# Patient Record
Sex: Female | Born: 1940
Health system: Southern US, Community
[De-identification: ages and names within clinical notes are randomized; demographics above are authoritative.]

## PROBLEM LIST (undated history)

## (undated) DIAGNOSIS — M1990 Unspecified osteoarthritis, unspecified site: Secondary | ICD-10-CM

## (undated) DIAGNOSIS — I10 Essential (primary) hypertension: Secondary | ICD-10-CM

## (undated) DIAGNOSIS — Z8619 Personal history of other infectious and parasitic diseases: Secondary | ICD-10-CM

## (undated) DIAGNOSIS — K219 Gastro-esophageal reflux disease without esophagitis: Secondary | ICD-10-CM

## (undated) DIAGNOSIS — E785 Hyperlipidemia, unspecified: Secondary | ICD-10-CM

## (undated) HISTORY — DX: Personal history of other infectious and parasitic diseases: Z86.19

## (undated) HISTORY — DX: Essential (primary) hypertension: I10

## (undated) HISTORY — DX: Hyperlipidemia, unspecified: E78.5

## (undated) HISTORY — DX: Unspecified osteoarthritis, unspecified site: M19.90

## (undated) HISTORY — PX: COLONOSCOPY: SHX174

## (undated) HISTORY — PX: DILATION AND CURETTAGE OF UTERUS: SHX78

## (undated) HISTORY — DX: Gastro-esophageal reflux disease without esophagitis: K21.9

---
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
## 1998-06-23 ENCOUNTER — Other Ambulatory Visit: Admission: RE | Admit: 1998-06-23 | Discharge: 1998-06-23 | Payer: Self-pay | Admitting: Gynecology

## 1999-11-23 ENCOUNTER — Other Ambulatory Visit: Admission: RE | Admit: 1999-11-23 | Discharge: 1999-11-23 | Payer: Self-pay | Admitting: Gynecology

## 2000-01-24 ENCOUNTER — Emergency Department (HOSPITAL_COMMUNITY): Admission: EM | Admit: 2000-01-24 | Discharge: 2000-01-24 | Payer: Self-pay | Admitting: Emergency Medicine

## 2000-12-30 ENCOUNTER — Other Ambulatory Visit: Admission: RE | Admit: 2000-12-30 | Discharge: 2000-12-30 | Payer: Self-pay | Admitting: Obstetrics and Gynecology

## 2003-01-15 ENCOUNTER — Other Ambulatory Visit: Admission: RE | Admit: 2003-01-15 | Discharge: 2003-01-15 | Payer: Self-pay | Admitting: *Deleted

## 2005-08-09 HISTORY — PX: ELBOW SURGERY: SHX618

## 2006-06-03 ENCOUNTER — Emergency Department (HOSPITAL_COMMUNITY): Admission: EM | Admit: 2006-06-03 | Discharge: 2006-06-03 | Payer: Self-pay | Admitting: Family Medicine

## 2006-06-09 ENCOUNTER — Ambulatory Visit (HOSPITAL_COMMUNITY): Admission: RE | Admit: 2006-06-09 | Discharge: 2006-06-10 | Payer: Self-pay | Admitting: Orthopedic Surgery

## 2008-04-12 ENCOUNTER — Emergency Department (HOSPITAL_COMMUNITY): Admission: EM | Admit: 2008-04-12 | Discharge: 2008-04-13 | Payer: Self-pay | Admitting: Emergency Medicine

## 2009-07-31 IMAGING — CR DG FINGER THUMB 2+V*R*
3 series · 3 of 3 positions shown · non-contrast
Comparison: None

CLINICAL DATA: Fall, laceration.

RIGHT THUMB 2+V

[x finger lateral right]
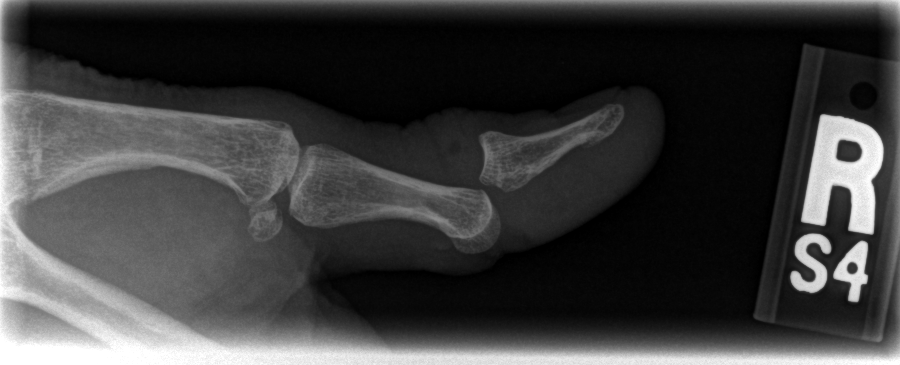

[x finger obl. right]
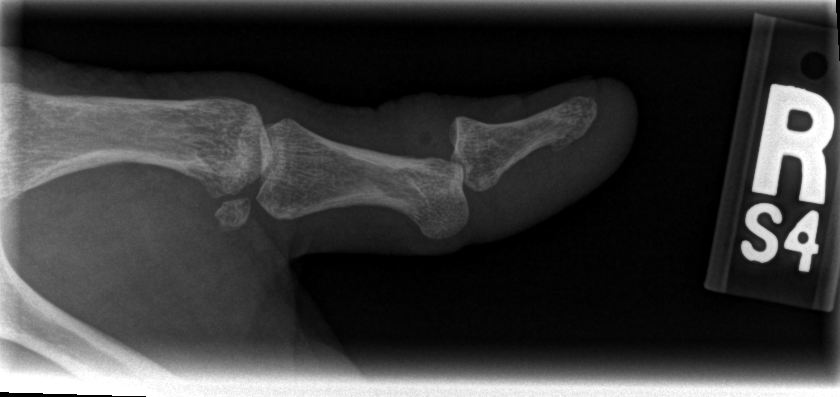

[x finger pa right]
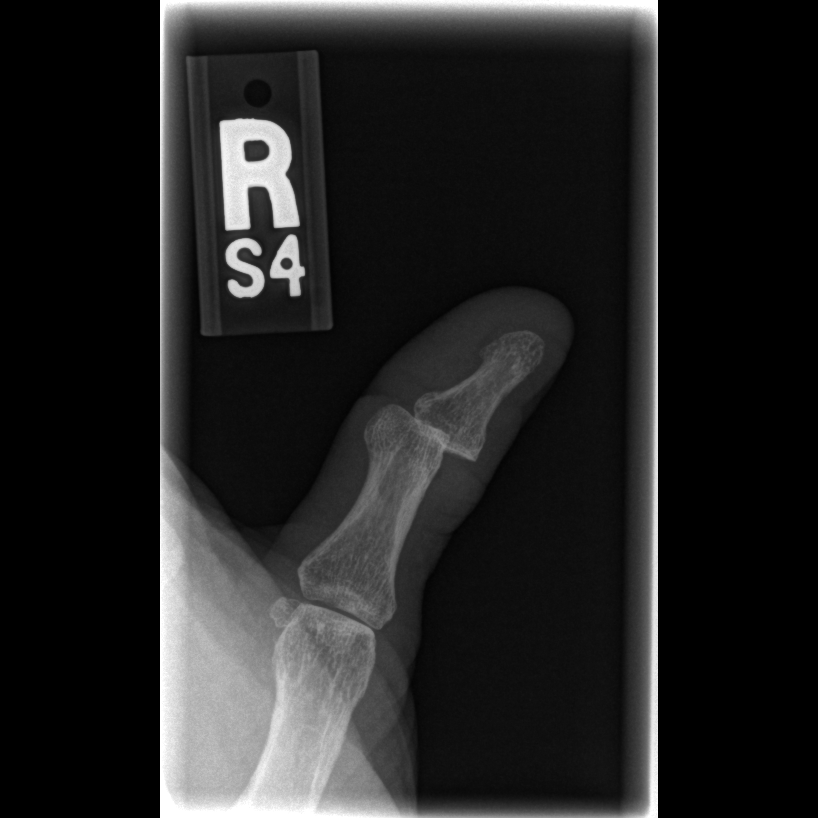

[3 of 3 positions shown; findings below may reference images not displayed]

FINDINGS: There is dislocation at the right interphalangeal joint.
Distal phalanx is dislocated posterior relative to the proximal
phalanx.  Small right gas in the soft tissues.  No fracture.
IMPRESSION: Posterior dislocation at the right thumb interphalangeal joint.

## 2009-07-31 IMAGING — CT CT HEAD W/O CM
1 series · 16 of 30 positions shown, 20 images · non-contrast
Comparison: None

CLINICAL DATA: Fall, forehead hematoma, headache.

CT HEAD WITHOUT CONTRAST
TECHNIQUE: Contiguous axial images were obtained from the base of
the skull through the vertex without contrast.

[Series 2: head_seq 4.5 h37s st · axial · 0.43mm/px · z∈[+1121,+1269]mm · 16 of 36 slices shown, 20 images]
[im 2/36  brain]
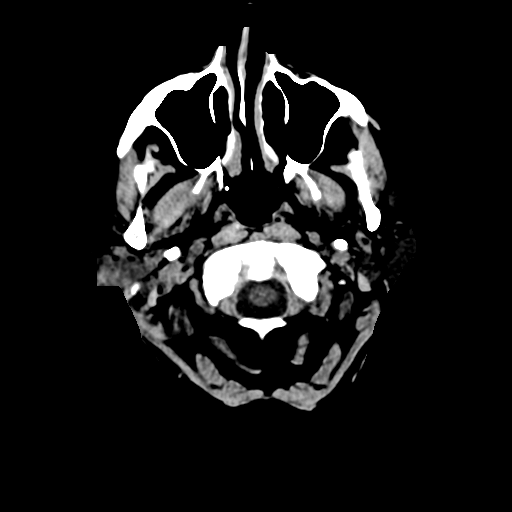
[im 2/36  bone]
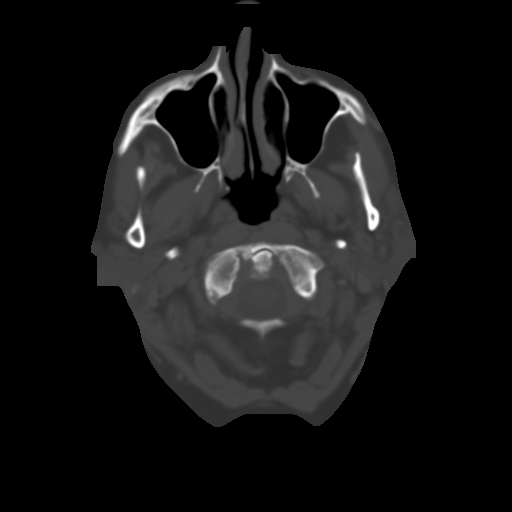
[im 4/36  brain]
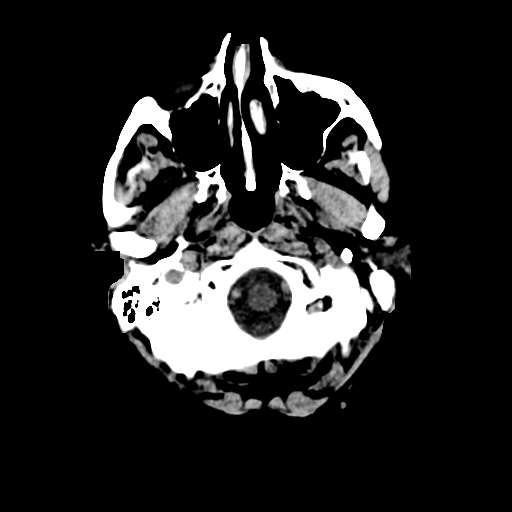
[im 7/36  brain]
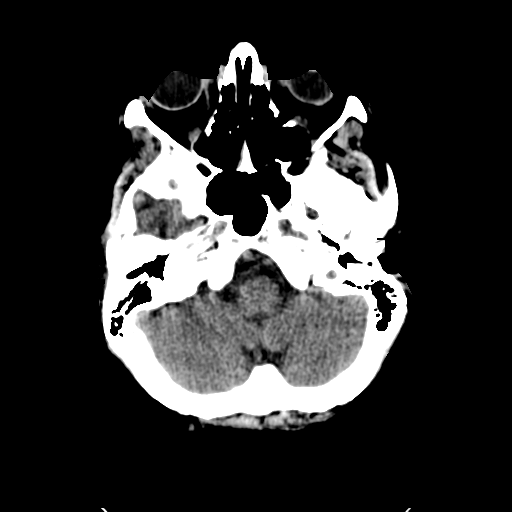
[im 9/36  brain]
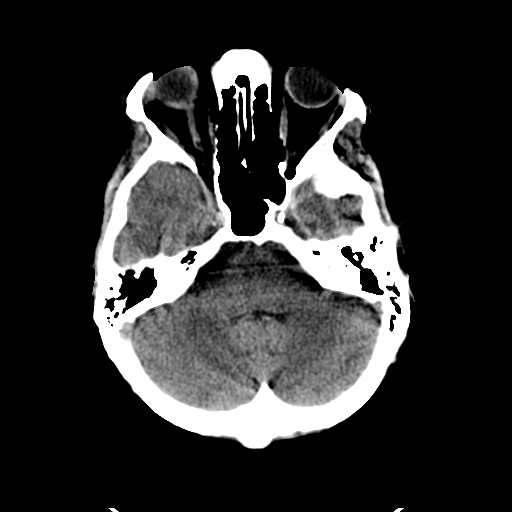
[im 10/36  brain]
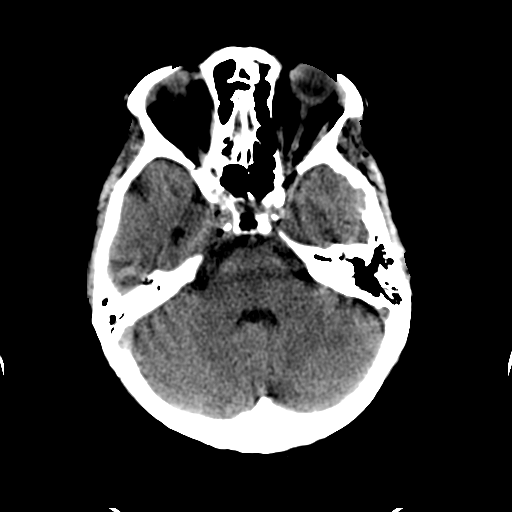
[im 10/36  bone]
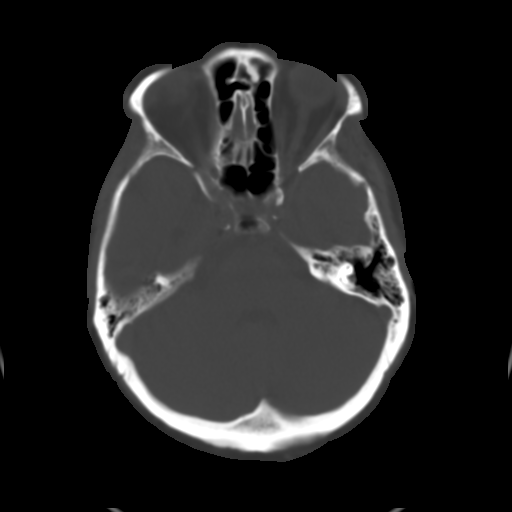
[im 13/36  brain]
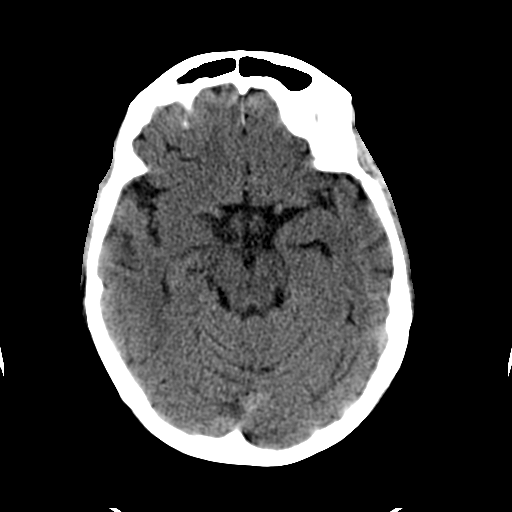
[im 15/36  brain]
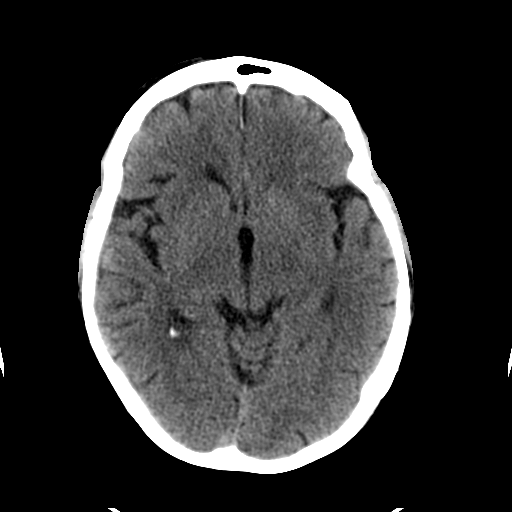
[im 17/36  brain]
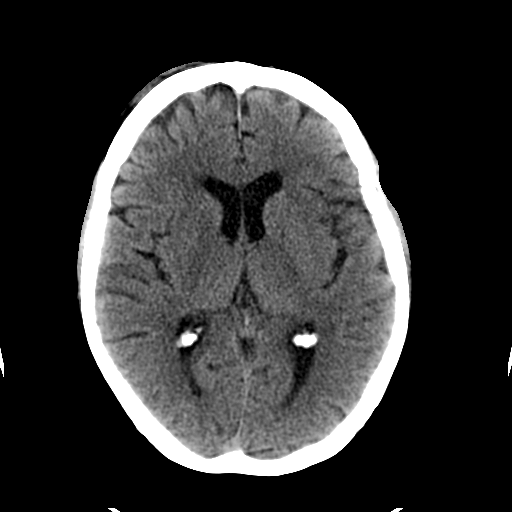
[im 19/36  brain]
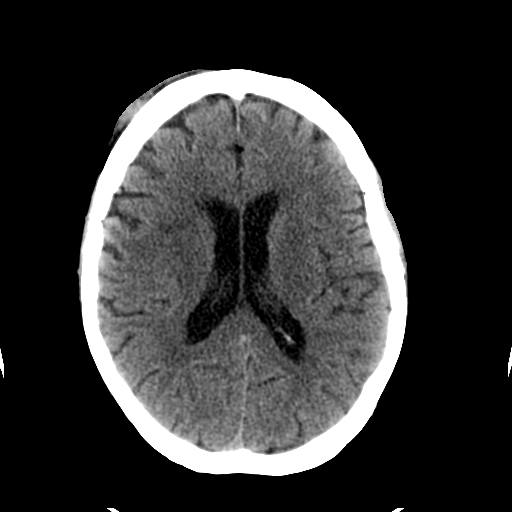
[im 19/36  bone]
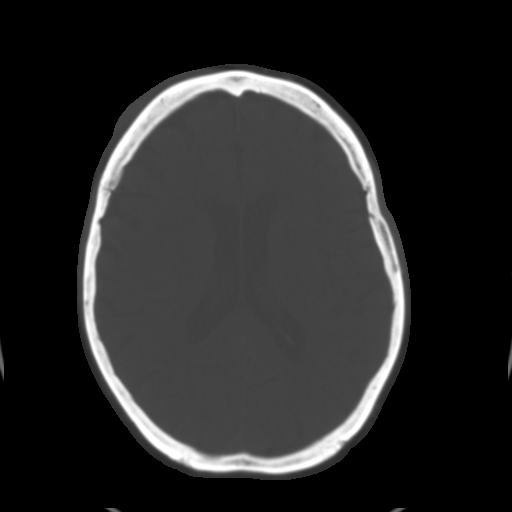
[im 21/36  brain]
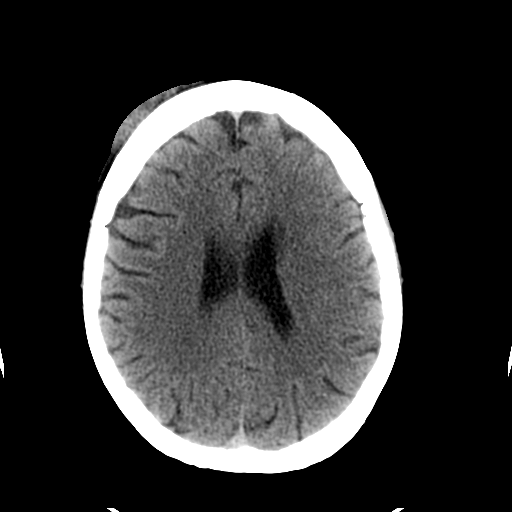
[im 23/36  brain]
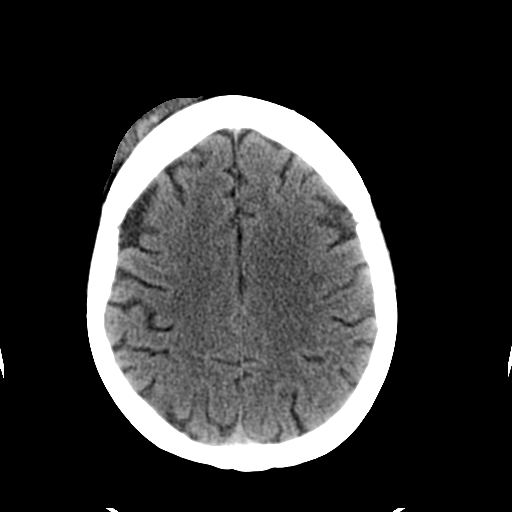
[im 26/36  brain]
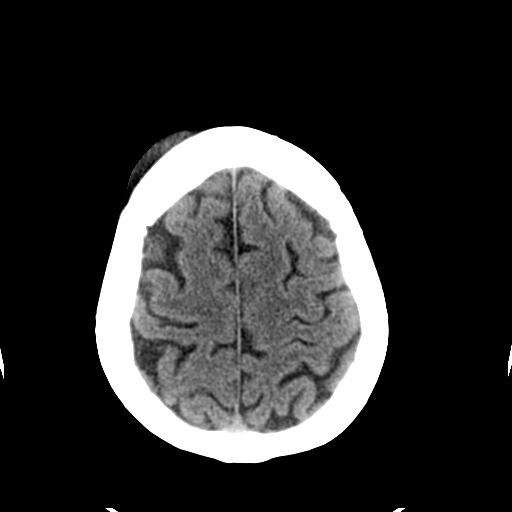
[im 27/36  brain]
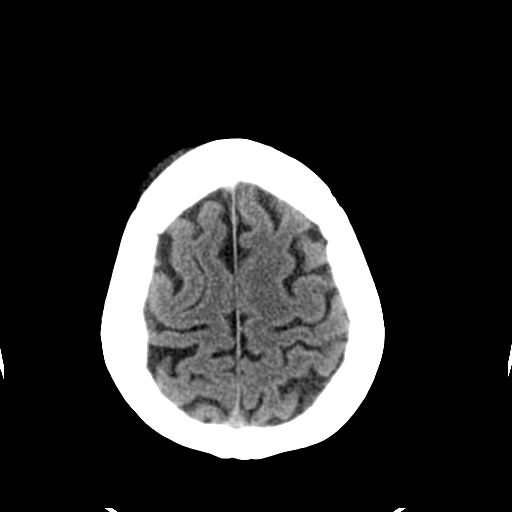
[im 27/36  bone]
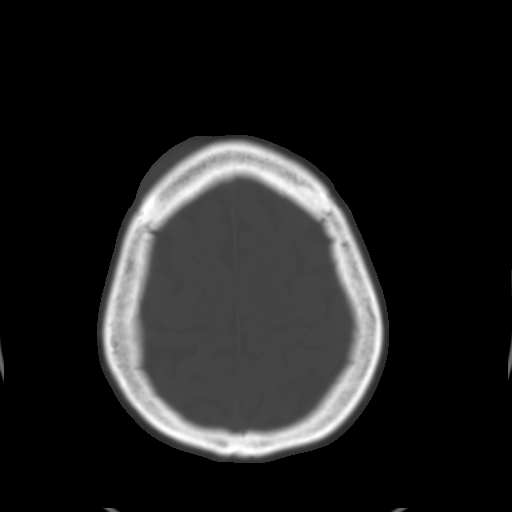
[im 29/36  brain]
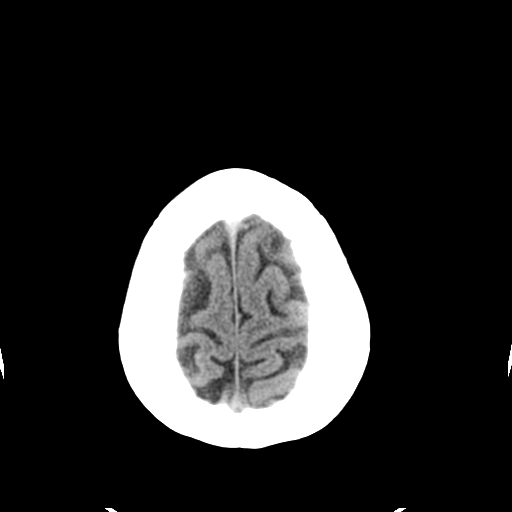
[im 32/36  brain]
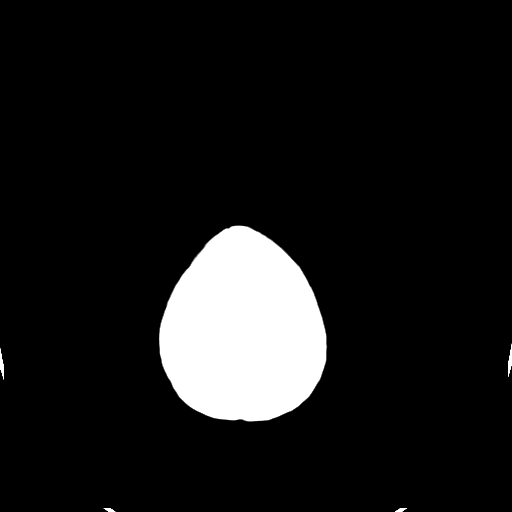
[im 34/36  brain]
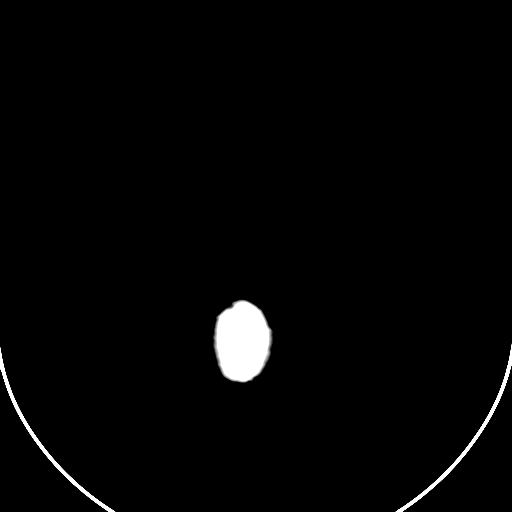

[16 of 30 positions shown; findings below may reference images not displayed]

FINDINGS: Soft tissue swelling over the right side of the forehead.
No acute intracranial abnormality.  Specifically, no hemorrhage,
hydrocephalus, mass lesion, acute infarction, or significant
intracranial injury.  No acute calvarial abnormality.
IMPRESSION: No acute intracranial abnormality.

## 2010-01-08 LAB — HM MAMMOGRAPHY: HM Mammogram: NORMAL

## 2010-11-08 LAB — HM PAP SMEAR: HM Pap smear: NORMAL

## 2010-11-08 LAB — HM DEXA SCAN: HM Dexa Scan: NORMAL

## 2010-11-09 ENCOUNTER — Ambulatory Visit (INDEPENDENT_AMBULATORY_CARE_PROVIDER_SITE_OTHER): Payer: Medicare Other | Admitting: Obstetrics and Gynecology

## 2010-11-09 ENCOUNTER — Other Ambulatory Visit (HOSPITAL_COMMUNITY)
Admission: RE | Admit: 2010-11-09 | Discharge: 2010-11-09 | Disposition: A | Discharge status: Home / Self Care | Payer: Medicare Other | Source: Ambulatory Visit | Attending: Obstetrics and Gynecology | Admitting: Obstetrics and Gynecology

## 2010-11-09 DIAGNOSIS — Z124 Encounter for screening for malignant neoplasm of cervix: Secondary | ICD-10-CM | POA: Insufficient documentation

## 2010-11-09 DIAGNOSIS — M545 Low back pain: Secondary | ICD-10-CM

## 2010-11-09 DIAGNOSIS — E7800 Pure hypercholesterolemia, unspecified: Secondary | ICD-10-CM

## 2010-11-09 DIAGNOSIS — R82998 Other abnormal findings in urine: Secondary | ICD-10-CM

## 2010-11-09 DIAGNOSIS — N952 Postmenopausal atrophic vaginitis: Secondary | ICD-10-CM

## 2010-11-09 DIAGNOSIS — N951 Menopausal and female climacteric states: Secondary | ICD-10-CM

## 2010-11-09 DIAGNOSIS — N949 Unspecified condition associated with female genital organs and menstrual cycle: Secondary | ICD-10-CM

## 2010-11-10 ENCOUNTER — Other Ambulatory Visit: Payer: Self-pay | Admitting: Obstetrics and Gynecology

## 2010-11-17 ENCOUNTER — Encounter (INDEPENDENT_AMBULATORY_CARE_PROVIDER_SITE_OTHER): Payer: Medicare Other

## 2010-11-17 DIAGNOSIS — M899 Disorder of bone, unspecified: Secondary | ICD-10-CM

## 2010-11-18 ENCOUNTER — Ambulatory Visit (INDEPENDENT_AMBULATORY_CARE_PROVIDER_SITE_OTHER): Payer: Medicare Other | Admitting: Obstetrics and Gynecology

## 2010-11-18 ENCOUNTER — Other Ambulatory Visit (INDEPENDENT_AMBULATORY_CARE_PROVIDER_SITE_OTHER): Payer: Medicare Other

## 2010-11-18 ENCOUNTER — Other Ambulatory Visit: Payer: Medicare Other

## 2010-11-18 DIAGNOSIS — N949 Unspecified condition associated with female genital organs and menstrual cycle: Secondary | ICD-10-CM

## 2010-11-18 DIAGNOSIS — N83339 Acquired atrophy of ovary and fallopian tube, unspecified side: Secondary | ICD-10-CM

## 2010-11-18 DIAGNOSIS — E7800 Pure hypercholesterolemia, unspecified: Secondary | ICD-10-CM

## 2010-11-18 DIAGNOSIS — D259 Leiomyoma of uterus, unspecified: Secondary | ICD-10-CM

## 2010-12-25 NOTE — Op Note (Signed)
Anna Hendricks, Anna Hendricks                ACCOUNT NO.:  [id number]   MEDICAL RECORD NO.:  [id number]          PATIENT TYPE:  OIB   LOCATION:  5022                         FACILITY:  [facility]   PHYSICIAN:  Dionne Ano. Gramig III, M.D.DATE OF BIRTH:  May 23, 1941   DATE OF PROCEDURE:  06/10/2006  DATE OF DISCHARGE:  06/10/2006                                 OPERATIVE REPORT   PREOPERATIVE DIAGNOSIS:  Left elbow (olecranon) displaced fracture, closed  in nature.   POSTOPERATIVE DIAGNOSIS:  Left elbow (olecranon) displaced fracture, closed  in nature.   PROCEDURE:  1. Open reduction and internal fixation, olecranon fracture, left elbow      with Christian Mate olecranon plate.  2. Stress radiography.  3. Olecranon bursa removal, left elbow.   SURGEON:  Dionne Ano. Amanda Pea, MD.   ASSISTANT:  Madelynn Done, MD.   COMPLICATIONS:  None.   ANESTHESIA:  General.   TOURNIQUET TIME:  Less than an hour.   INDICATIONS FOR THE PROCEDURE:  Ms. Anna Hendricks is a pleasant, 70 year old female  who presents with the above-mentioned diagnosis.  I have counseled her in  regards to risks and benefits of surgery including the risk of infection,  bleeding, anesthesia, damage to normal structures, and failure of the  surgery to __________  of relieving symptoms and restoring function.  With  this in mind, she desires to proceed.  All questions have been encouraged  and answered preoperatively.   OPERATIVE PROCEDURE:  The patient was seen by myself and anesthesia and  taken to the operative suite.  Once in the operative suite, preoperative  Ancef was given.  She was placed supine and appropriately padded and prepped  and draped in the usual sterile fashion with Betadine scrub and paint.   Once the sterile field was secured, the arm was elevated, the tourniquet was  insufflated to 250 mmHg and a posterior medial incision was made about the  elbow.  The patient did have a very large bursa and this bursa  was  diligently and meticulously removed so as to prevent hematoma formation and  healing problems in the future.   Following this dissection was carried down to the fracture site sharply.  The olecranon fracture was exposed.  Curettage of the bony end, irrigation  and fracture site debridement ensued.  This was tolerated well.  I then  placed provisional clamps for reduction purposes about the olecranon  achieving a nice, perfectly contoured fit of the prior fracture.  With the  patient reduced, I then applied a [employer] olecranon plate.  This was done in standard AO technique.  The patient had excellent fixation  and purchase of the plate.  Prior to final plate application, I did perform  provisional fixation with Kirchner wire as well.   Once the plate was applied nicely, including intermedullary and cortical  plate fixation, I then removed the provisional fixation and ranged the elbow  through a full range of motion realizing restoration of anatomy, full range  of motion and no complicated features.  Live fluoroscopy was brought in and  I stress tested the construct.  This was excellent.  The joint was well  reduced.  The fracture site obliterated with the fixation and anatomic  alignment noted.  I then performed closure of the ECU/FCU interval and  periosteum with Vicryl.  The patient had a slight recess in her triceps. A  full plate application and this was closed as well.   Following this, further irrigation followed by 3-0 Vicryl and staple clips  through the skin edge were applied.  The final closure was done with the  tourniquet deflated to aid in hemostasis, of course, which was secured.   Following this, a short dressing was applied and the patient had a posterior  plaster splint at 70 to 60 degrees placed.  She tolerated the procedure well  and there were no complicating features.   She was taken to the recovery room.  She will be monitored.  We will plan   for elevation, IV antibiotics and pain management according to her needs,  etc.  We will see her back in the office in 10-14 days after discharge for  suture removal, x-rays and we will begin full range of motion at that time  with therapeutic endeavors.  I have discussed with the patient all do's and  don'ts, etc., And all questions have been encouraged and answered.  It has  been a pleasure to participate in her care.  I look forward to participating  in her operative recovery.           ______________________________  Dionne Ano. Everlene Other, M.D.     Nash Mantis  D:  06/10/2006  T:  06/11/2006  Job:  [id number]

## 2011-03-02 ENCOUNTER — Ambulatory Visit (INDEPENDENT_AMBULATORY_CARE_PROVIDER_SITE_OTHER): Payer: Medicare Other | Admitting: Family Medicine

## 2011-03-02 ENCOUNTER — Encounter: Payer: Self-pay | Admitting: Family Medicine

## 2011-03-02 DIAGNOSIS — R030 Elevated blood-pressure reading, without diagnosis of hypertension: Secondary | ICD-10-CM | POA: Insufficient documentation

## 2011-03-02 DIAGNOSIS — Z1211 Encounter for screening for malignant neoplasm of colon: Secondary | ICD-10-CM | POA: Insufficient documentation

## 2011-03-02 DIAGNOSIS — Z1231 Encounter for screening mammogram for malignant neoplasm of breast: Secondary | ICD-10-CM | POA: Insufficient documentation

## 2011-03-02 DIAGNOSIS — E785 Hyperlipidemia, unspecified: Secondary | ICD-10-CM

## 2011-03-02 NOTE — Progress Notes (Signed)
Subjective:    Patient ID: Anna Hendricks, female    DOB: [birth year], 70 y.o.   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 Smoked for years  Got asthma  No copd   mammo -- last -- was in 2011 and ? Unsure what month  She likes to get them every other year - but is open to yearly - will think about it    imms Td was 2009 Pneumovax about 5 years ago at age 70  Has zostavax ? 4 years ago  Does get a flu shot every year     Colon screen - has had 2 of them One when very young (blood in stool ) -- was fine Last one was 10 years ago    Takes some vitamins   Bone density Just had one -- -- no change  Normal range she thinks - was in April   bp up today 158/90 Feels fine No cp or ha or palp or edema  Thinks this is white coat bp at home are fine   Patient Active Problem List  Diagnoses  . Hyperlipidemia  . Blood pressure elevated without history of HTN  . Special screening for malignant neoplasms, colon  . Other screening mammogram   Past Medical History  Diagnosis Date  . History of chicken pox   . Arthritis   . GERD (gastroesophageal reflux disease)   . Hyperlipidemia    Past  Surgical History  Procedure Date  . Appendectomy 1955  . Elbow surgery 2007   History  Substance Use Topics  . Smoking status: Former Smoker    Quit date: 08/10/1987  . Smokeless tobacco: Not on file  . Alcohol Use: Yes   Family History  Problem Relation Age of Onset  . Arthritis Mother   . Hyperlipidemia Mother   . Stroke Mother   . Hypertension Father   . Cancer Maternal Aunt     breast CA   No Known Allergies No current outpatient prescriptions on file prior to visit.       Review of Systems Review of Systems  Constitutional: Negative for fever, appetite change, fatigue and unexpected weight change.  Eyes: Negative for pain and visual disturbance.  Respiratory: Negative for cough and shortness of breath.   Cardiovascular: Negative for cp or sob or palpitations .   Gastrointestinal: Negative for nausea, diarrhea and constipation.  Genitourinary: Negative for urgency and frequency.  Skin: Negative for pallor. or rash  Neurological: Negative for weakness, light-headedness, numbness and headaches.  Hematological: Negative for adenopathy. Does not bruise/bleed easily.  Psychiatric/Behavioral: Negative for dysphoric mood. The patient is not nervous/anxious.          Objective:   Physical Exam  Constitutional: She appears well-developed and well-nourished. No distress.  HENT:  Head: Normocephalic and atraumatic.  Right Ear: External ear normal.  Left Ear: External ear normal.  Nose: Nose normal.  Eyes: Conjunctivae and EOM are normal. Pupils are equal, round, and reactive to light.  Neck: Normal range of motion. Neck supple. No JVD present. No thyromegaly present.  Cardiovascular: Normal rate, regular rhythm, normal heart sounds and intact distal pulses.   Pulmonary/Chest: Effort normal and breath sounds normal. No respiratory distress. She has no wheezes.  Abdominal: Soft. Bowel sounds are normal. She exhibits no distension and no mass. There is no tenderness.    Musculoskeletal: Normal range of motion. She exhibits no edema and no tenderness.  Lymphadenopathy:    She has no cervical adenopathy.  Neurological: She is alert. She has normal reflexes. No cranial nerve deficit. Coordination normal.  Skin: Skin is warm and dry. No rash noted. No erythema. No pallor.  Psychiatric: She has a normal mood and affect.          Assessment & Plan:

## 2011-03-02 NOTE — Assessment & Plan Note (Signed)
Pt thinks she has white coat syndrome Will start checking bp at home Also bring cuff to f/u in 1 mo  Will re check and make a plan Disc lifestyle - excellent exercise

## 2011-03-02 NOTE — Patient Instructions (Addendum)
Please send to [facility] for last mammogram  If you change your mind about getting mammogram yearly- please let me know  Keep zantac at home for use as needed -- and if your reflux worsens or becomes persistant let me know  Avoid red meat/ fried foods/ egg yolks/ fatty breakfast meats/ butter, cheese and high fat dairy/ and shellfish   Schedule fasting lab in 1 month and then follow up  We will refer you for colonoscopy at check out  bp is high - check some at home and bring your cuff with you to next visit

## 2011-03-02 NOTE — Assessment & Plan Note (Signed)
Pt interested in screening colonosc Will ref for that Dr Juanda Chance in the past

## 2011-03-02 NOTE — Assessment & Plan Note (Signed)
From gyn office lipid profile shows chol 317 and HDL 89 and trig 131 and LDL 202 Quite high  Thinks it has been this way for a while Good diet except egg yolks- we disc this in detail and rev low sat fat diet  Will re check in 1 mo and f/u Suspect this is hereditary and we will need to disc statin treatment

## 2011-03-11 ENCOUNTER — Encounter: Payer: Self-pay | Admitting: Family Medicine

## 2011-03-30 ENCOUNTER — Other Ambulatory Visit (INDEPENDENT_AMBULATORY_CARE_PROVIDER_SITE_OTHER): Payer: Medicare Other | Admitting: Family Medicine

## 2011-03-30 DIAGNOSIS — E785 Hyperlipidemia, unspecified: Secondary | ICD-10-CM

## 2011-03-30 DIAGNOSIS — R030 Elevated blood-pressure reading, without diagnosis of hypertension: Secondary | ICD-10-CM

## 2011-03-30 LAB — COMPREHENSIVE METABOLIC PANEL
Albumin: 4.6 g/dL (ref 3.5–5.2)
Calcium: 9.6 mg/dL (ref 8.4–10.5)
Creatinine, Ser: 0.7 mg/dL (ref 0.4–1.2)
Sodium: 142 mEq/L (ref 135–145)
Total Protein: 7.8 g/dL (ref 6.0–8.3)

## 2011-03-30 LAB — TSH: TSH: 1.84 u[IU]/mL (ref 0.35–5.50)

## 2011-03-30 LAB — LIPID PANEL: Cholesterol: 314 mg/dL — ABNORMAL HIGH (ref 0–200)

## 2011-04-01 ENCOUNTER — Encounter: Payer: Self-pay | Admitting: Internal Medicine

## 2011-04-01 ENCOUNTER — Ambulatory Visit (AMBULATORY_SURGERY_CENTER): Payer: Medicare Other | Admitting: *Deleted

## 2011-04-01 VITALS — Ht 62.0 in | Wt 135.0 lb

## 2011-04-01 DIAGNOSIS — Z1211 Encounter for screening for malignant neoplasm of colon: Secondary | ICD-10-CM

## 2011-04-01 MED ORDER — SUPREP BOWEL PREP KIT 17.5-3.13-1.6 GM/177ML PO SOLN: 1.0000 | ORAL | Status: DC | PRN

## 2011-04-02 ENCOUNTER — Encounter: Payer: Self-pay | Admitting: Family Medicine

## 2011-04-02 ENCOUNTER — Ambulatory Visit (INDEPENDENT_AMBULATORY_CARE_PROVIDER_SITE_OTHER): Payer: Medicare Other | Admitting: Family Medicine

## 2011-04-02 DIAGNOSIS — I10 Essential (primary) hypertension: Secondary | ICD-10-CM | POA: Insufficient documentation

## 2011-04-02 DIAGNOSIS — E785 Hyperlipidemia, unspecified: Secondary | ICD-10-CM

## 2011-04-02 MED ORDER — ATORVASTATIN CALCIUM 20 MG PO TABS: 20.0000 mg | ORAL_TABLET | Freq: Every day | ORAL | Status: DC

## 2011-04-02 MED ORDER — AMLODIPINE BESYLATE 5 MG PO TABS: 5.0000 mg | ORAL_TABLET | Freq: Every day | ORAL | Status: DC

## 2011-04-02 NOTE — Progress Notes (Signed)
Subjective:    Patient ID: Anna Hendricks, female    DOB: [birth year], 70 y.o.   MRN: [id number]  HPI Here for f/u of elevated bp and hyperlipidemia   Adv at last visit to check bp at home Here is 152/78 Has not been checking it at home -- none of her bp cuffs have worked  She thinks it is time to go on medication   HTN runs in the family  Does not tend to swell  No headache  Or cp or palpitations      Cholesterol - last LDL in 200s  Has been watching diet  Lab Results  Component Value Date   CHOL 314* 03/30/2011   Lab Results  Component Value Date   HDL 66.80 03/30/2011   No results found for this basename: [facility]   Lab Results  Component Value Date   TRIG 141.0 03/30/2011   Lab Results  Component Value Date   CHOLHDL 5 03/30/2011   Lab Results  Component Value Date   LDLDIRECT 223.7 03/30/2011   diet is very very good and low sat fat  Mother had very high chol as well    K is mildly high occas cantelope Not a lot of high K foods  MVI could have K in it  No cramping or other symptoms   Patient Active Problem List  Diagnoses  . Hyperlipidemia  . Special screening for malignant neoplasms, colon  . Other screening mammogram  . Hypertension   Past Medical History  Diagnosis Date  . History of chicken pox   . Arthritis   . GERD (gastroesophageal reflux disease)   . Hyperlipidemia   . Hypertension    Past Surgical History  Procedure Date  . Appendectomy 1955  . Elbow surgery 2007  . Colonoscopy   . Dilation and curettage of uterus    History  Substance Use Topics  . Smoking status: Former Smoker    Quit date: 08/10/1987  . Smokeless tobacco: Never Used  . Alcohol Use: 2.4 oz/week    4 Glasses of wine per week   Family History  Problem Relation Age of Onset  . Arthritis Mother   . Hyperlipidemia Mother   . Stroke Mother   . Hypertension Father   . Cancer Maternal Aunt     breast CA   No Known Allergies Current Outpatient Prescriptions  on File Prior to Visit  Medication Sig Dispense Refill  . CALCIUM-VITAMIN D PO Take by mouth as directed.        . Cholecalciferol (VITAMIN D) 2000 UNITS tablet Take 2,000 Units by mouth daily.        . fish oil-omega-3 fatty acids 1000 MG capsule Take 600 mg by mouth daily.        . Multiple Vitamin (MULTIVITAMIN) tablet Take 1 tablet by mouth daily.        . vitamin C (ASCORBIC ACID) 500 MG tablet Take 500 mg by mouth daily.        . vitamin E 400 UNIT capsule Take 400 Units by mouth daily.        Rolene Arbour BOWEL PREP SOLN Take 1 kit by mouth as needed.  354 mL  0       Review of Systems Review of Systems  Constitutional: Negative for fever, appetite change, fatigue and unexpected weight change.  Eyes: Negative for pain and visual disturbance.  Respiratory: Negative for cough and shortness of breath.   Cardiovascular: Negative.  for cp or  palpitations Gastrointestinal: Negative for nausea, diarrhea and constipation.  Genitourinary: Negative for urgency and frequency.  Skin: Negative for pallor. or rash  Neurological: Negative for weakness, light-headedness, numbness and headaches.  Hematological: Negative for adenopathy. Does not bruise/bleed easily.  Psychiatric/Behavioral: Negative for dysphoric mood. The patient is not nervous/anxious.          Objective:   Physical Exam  Constitutional: She appears well-developed and well-nourished. No distress.  HENT:  Head: Normocephalic and atraumatic.  Mouth/Throat: Oropharynx is clear and moist.  Eyes: Conjunctivae and EOM are normal. Pupils are equal, round, and reactive to light.  Neck: Normal range of motion. Neck supple. No JVD present. Carotid bruit is not present. No thyromegaly present.  Cardiovascular: Normal rate, regular rhythm, normal heart sounds and intact distal pulses.   Pulmonary/Chest: Effort normal and breath sounds normal. No respiratory distress. She has no wheezes.  Abdominal: Soft. Bowel sounds are normal. She  exhibits no distension, no abdominal bruit and no mass. There is no tenderness.  Musculoskeletal: Normal range of motion. She exhibits no edema and no tenderness.  Lymphadenopathy:    She has no cervical adenopathy.  Neurological: She is alert. She has normal reflexes. No cranial nerve deficit. Coordination normal.  Skin: Skin is warm and dry. No rash noted. No erythema. No pallor.  Psychiatric: She has a normal mood and affect.          Assessment & Plan:

## 2011-04-02 NOTE — Assessment & Plan Note (Signed)
Very high again despite near perfect diet  Start lipitor 20 mg - rev poss side eff Lab 1 mo  F/u 6 wk  Rev low sat fat diet again Rev lab in detail with pt

## 2011-04-02 NOTE — Assessment & Plan Note (Signed)
New with high systolic No symptoms  Start amlodipine 5 mg daily Update if side eff Disc low sodium diet and exercise

## 2011-04-02 NOTE — Patient Instructions (Addendum)
Start norvasc 5mg  daily for blood pressure Start lipitor 20 mg daily in evening for cholesterol  If any side effects - like muscle aches and pains- let me know Schedule fasting labs in 1 month  Follow up with me in 6 weeks  Blood pressure cuff- OMRON for arm - regular size  Eat healthy and exercise  Stop your multivitamin

## 2011-04-16 ENCOUNTER — Other Ambulatory Visit: Payer: Medicare Other | Admitting: Internal Medicine

## 2011-04-16 ENCOUNTER — Encounter: Payer: Self-pay | Admitting: Internal Medicine

## 2011-04-16 ENCOUNTER — Ambulatory Visit (AMBULATORY_SURGERY_CENTER): Payer: Medicare Other | Admitting: Internal Medicine

## 2011-04-16 VITALS — BP 148/87 | HR 63 | Temp 98.0°F | Resp 16 | Ht 62.0 in | Wt 135.0 lb

## 2011-04-16 DIAGNOSIS — D126 Benign neoplasm of colon, unspecified: Secondary | ICD-10-CM

## 2011-04-16 DIAGNOSIS — Z1211 Encounter for screening for malignant neoplasm of colon: Secondary | ICD-10-CM

## 2011-04-16 MED ORDER — SODIUM CHLORIDE 0.9 % IV SOLN: 500.0000 mL | INTRAVENOUS | Status: DC

## 2011-04-16 NOTE — Patient Instructions (Addendum)
RESUME ALL MEDICATIONS. INFORMATION GIVEN ON POLYPS, DIVERTICULOSIS, HIGH FIBER DIET.PHYSICIAN RECOMMEND METAMUCIL 1 TSP DAILY. D/C INSTRUCTIONS COMPLETED.

## 2011-04-19 ENCOUNTER — Telehealth: Payer: Self-pay | Admitting: *Deleted

## 2011-04-19 NOTE — Telephone Encounter (Signed)

## 2011-04-21 ENCOUNTER — Encounter: Payer: Self-pay | Admitting: Internal Medicine

## 2011-05-03 ENCOUNTER — Other Ambulatory Visit (INDEPENDENT_AMBULATORY_CARE_PROVIDER_SITE_OTHER): Payer: Medicare Other

## 2011-05-03 DIAGNOSIS — E785 Hyperlipidemia, unspecified: Secondary | ICD-10-CM

## 2011-05-03 DIAGNOSIS — I10 Essential (primary) hypertension: Secondary | ICD-10-CM

## 2011-05-03 LAB — ALT: ALT: 22 U/L (ref 0–35)

## 2011-05-03 LAB — POTASSIUM: Potassium: 4.3 mEq/L (ref 3.5–5.1)

## 2011-05-03 LAB — LIPID PANEL
Cholesterol: 214 mg/dL — ABNORMAL HIGH (ref 0–200)
HDL: 95.8 mg/dL (ref >39.00)
VLDL: 12 mg/dL (ref 0.0–40.0)

## 2011-05-03 NOTE — Progress Notes (Signed)
Addended by: Baldomero Lamy on: 05/03/2011 10:54 AM   Modules accepted: Orders

## 2011-05-14 ENCOUNTER — Encounter: Payer: Self-pay | Admitting: Family Medicine

## 2011-05-14 ENCOUNTER — Ambulatory Visit (INDEPENDENT_AMBULATORY_CARE_PROVIDER_SITE_OTHER): Payer: Medicare Other | Admitting: Family Medicine

## 2011-05-14 VITALS — BP 148/72 | HR 68 | Temp 98.1°F | Ht 62.0 in | Wt 134.8 lb

## 2011-05-14 DIAGNOSIS — Z23 Encounter for immunization: Secondary | ICD-10-CM

## 2011-05-14 DIAGNOSIS — I10 Essential (primary) hypertension: Secondary | ICD-10-CM

## 2011-05-14 DIAGNOSIS — E785 Hyperlipidemia, unspecified: Secondary | ICD-10-CM

## 2011-05-14 MED ORDER — LISINOPRIL 10 MG PO TABS: 10.0000 mg | ORAL_TABLET | Freq: Every day | ORAL | Status: DC

## 2011-05-14 NOTE — Progress Notes (Signed)
Subjective:    Patient ID: Anna Hendricks, female    DOB: [birth year], 70 y.o.   MRN: [id number]  HPI Here for f/u of HTN and also hyperlipidemia   Last visit started norvasc 5 mg  No cp or ha or edema No problems   Lipids are much imp on lipitor LDL down much Lab Results  Component Value Date   CHOL 214* 05/03/2011   HDL 95.80 05/03/2011   LDLDIRECT 110.7 05/03/2011   TRIG 60.0 05/03/2011   CHOLHDL 2 05/03/2011   last LDL was over 200   Diet has been very good - stays away from sat fats  Has tried egg beaters   Tolerates lipitor well - at first had some muscle aches and now it is better (? If was in her mind)    Wt stable - eats a healthy diet   Patient Active Problem List  Diagnoses  . Hyperlipidemia  . Special screening for malignant neoplasms, colon  . Other screening mammogram  . Hypertension   Past Medical History  Diagnosis Date  . History of chicken pox   . Arthritis   . GERD (gastroesophageal reflux disease)   . Hyperlipidemia   . Hypertension    Past Surgical History  Procedure Date  . Appendectomy 1955  . Elbow surgery 2007  . Colonoscopy   . Dilation and curettage of uterus    History  Substance Use Topics  . Smoking status: Former Smoker    Quit date: 08/10/1987  . Smokeless tobacco: Never Used  . Alcohol Use: 2.4 oz/week    4 Glasses of wine per week   Family History  Problem Relation Age of Onset  . Arthritis Mother   . Hyperlipidemia Mother   . Stroke Mother   . Hypertension Father   . Cancer Maternal Aunt     breast CA   No Known Allergies Current Outpatient Prescriptions on File Prior to Visit  Medication Sig Dispense Refill  . atorvastatin (LIPITOR) 20 MG tablet Take 1 tablet (20 mg total) by mouth daily.  30 tablet  11  . CALCIUM-VITAMIN D PO Take by mouth as directed.        . Cholecalciferol (VITAMIN D) 2000 UNITS tablet Take 2,000 Units by mouth daily.        . fish oil-omega-3 fatty acids 1000 MG capsule Take 600 mg by  mouth daily.        . Multiple Vitamin (MULTIVITAMIN) tablet Take 1 tablet by mouth daily.        . vitamin C (ASCORBIC ACID) 500 MG tablet Take 500 mg by mouth daily.        . vitamin E 400 UNIT capsule Take 400 Units by mouth daily.            Review of Systems Review of Systems  Constitutional: Negative for fever, appetite change, fatigue and unexpected weight change.  Eyes: Negative for pain and visual disturbance.  Respiratory: Negative for cough and shortness of breath.   Cardiovascular: Negative for cp or palpitations    Gastrointestinal: Negative for nausea, diarrhea and constipation.  Genitourinary: Negative for urgency and frequency.  Skin: Negative for pallor or rash   Neurological: Negative for weakness, light-headedness, numbness and headaches.  Hematological: Negative for adenopathy. Does not bruise/bleed easily.  Psychiatric/Behavioral: Negative for dysphoric mood. The patient is not nervous/anxious.          Objective:   Physical Exam  Constitutional: She appears well-developed and well-nourished.  HENT:  Head: Normocephalic and atraumatic.  Mouth/Throat: Oropharynx is clear and moist.  Eyes: Conjunctivae and EOM are normal. Pupils are equal, round, and reactive to light.  Neck: Normal range of motion. Neck supple. No JVD present. Carotid bruit is not present. No thyromegaly present.  Cardiovascular: Normal rate, regular rhythm, normal heart sounds and intact distal pulses.  Exam reveals no gallop.   Pulmonary/Chest: Effort normal and breath sounds normal. No respiratory distress. She has no wheezes.  Abdominal: Soft. Bowel sounds are normal. She exhibits no abdominal bruit.  Musculoskeletal: She exhibits no edema.  Lymphadenopathy:    She has no cervical adenopathy.  Neurological: She is alert. She has normal reflexes.  Skin: Skin is warm and dry. No rash noted. No erythema. No pallor.  Psychiatric: She has a normal mood and affect.          Assessment &  Plan:

## 2011-05-14 NOTE — Assessment & Plan Note (Signed)
No improvement on amlodipine 5 Will change to lisinopril 10 mg daily Rev poss side eff incl cough -will update F/u 4-6 wk

## 2011-05-14 NOTE — Assessment & Plan Note (Signed)
Much improved on lipitor 20 and pt is tolerating it well  Rev low sat fat diet -doing great

## 2011-05-14 NOTE — Patient Instructions (Addendum)
Stop the amlodipine (norvasc) because it is not doing the job Don't throw it away  Start lisinopril 10 mg daily  Follow up in 4-6 week Potassium is better Cholesterol is better Keep up the good habits  Flu shot today

## 2011-06-25 ENCOUNTER — Ambulatory Visit: Payer: Medicare Other | Admitting: Family Medicine

## 2011-07-06 ENCOUNTER — Ambulatory Visit: Payer: Medicare Other | Admitting: Family Medicine

## 2012-05-03 ENCOUNTER — Other Ambulatory Visit: Payer: Self-pay

## 2012-05-03 MED ORDER — LISINOPRIL 10 MG PO TABS: 10.0000 mg | ORAL_TABLET | Freq: Every day | ORAL | Status: DC

## 2012-05-03 NOTE — Telephone Encounter (Signed)
Pt left v/m requesting refill lisiniopril to [facility] [address]. Pt last seen 05/14/11.pt scheduled CPX 09/24/12.Please advise.

## 2012-05-03 NOTE — Telephone Encounter (Signed)
Go ahead and refil until her physical- thanks

## 2012-05-17 ENCOUNTER — Other Ambulatory Visit: Payer: Self-pay

## 2012-05-17 MED ORDER — ATORVASTATIN CALCIUM 20 MG PO TABS: 20.0000 mg | ORAL_TABLET | Freq: Every day | ORAL | Status: DC

## 2012-05-17 NOTE — Telephone Encounter (Signed)
Pt request refill atorvastatin # 30 x 6. Pt has CPX scheduled 10/05/11. Pt notified done.

## 2012-06-01 ENCOUNTER — Other Ambulatory Visit: Payer: Self-pay | Admitting: Dermatology

## 2012-09-02 ENCOUNTER — Other Ambulatory Visit: Payer: Self-pay | Admitting: Family Medicine

## 2012-09-04 ENCOUNTER — Other Ambulatory Visit: Payer: Self-pay

## 2012-09-04 MED ORDER — LISINOPRIL 10 MG PO TABS: 10.0000 mg | ORAL_TABLET | Freq: Every day | ORAL | Status: DC

## 2012-09-04 NOTE — Telephone Encounter (Signed)
Pt left v/m requesting refill lisinopril [facility] on [address]. Pt already scheduled CPX 10/04/12. Pt notified refill done.

## 2012-09-25 ENCOUNTER — Telehealth: Payer: Self-pay | Admitting: Family Medicine

## 2012-09-25 DIAGNOSIS — I10 Essential (primary) hypertension: Secondary | ICD-10-CM

## 2012-09-25 DIAGNOSIS — E785 Hyperlipidemia, unspecified: Secondary | ICD-10-CM

## 2012-09-25 NOTE — Telephone Encounter (Signed)
Message copied by Judy Pimple on Mon Sep 25, 2012  9:20 PM ------      Message from: Alvina Chou      Created: Tue Sep 19, 2012  3:33 PM      Regarding: Lab orders for Tuesday, 2.18.14       Patient is scheduled for CPX labs, please order future labs, Thanks , Terri       ------

## 2012-09-26 ENCOUNTER — Other Ambulatory Visit (INDEPENDENT_AMBULATORY_CARE_PROVIDER_SITE_OTHER): Payer: Medicare Other

## 2012-09-26 DIAGNOSIS — E785 Hyperlipidemia, unspecified: Secondary | ICD-10-CM

## 2012-09-26 DIAGNOSIS — I10 Essential (primary) hypertension: Secondary | ICD-10-CM

## 2012-09-26 LAB — COMPREHENSIVE METABOLIC PANEL
Albumin: 4.6 g/dL (ref 3.5–5.2)
BUN: 20 mg/dL (ref 6–23)
Calcium: 9.9 mg/dL (ref 8.4–10.5)
Chloride: 104 mEq/L (ref 96–112)
Glucose, Bld: 98 mg/dL (ref 70–99)
Potassium: 4.8 mEq/L (ref 3.5–5.1)

## 2012-09-26 LAB — LIPID PANEL
Cholesterol: 251 mg/dL — ABNORMAL HIGH (ref 0–200)
Triglycerides: 85 mg/dL (ref 0.0–149.0)

## 2012-09-26 LAB — CBC WITH DIFFERENTIAL/PLATELET
Basophils Relative: 0.5 % (ref 0.0–3.0)
Eosinophils Relative: 3.9 % (ref 0.0–5.0)
Lymphocytes Relative: 27.3 % (ref 12.0–46.0)
Neutrophils Relative %: 60.6 % (ref 43.0–77.0)
RBC: 4.13 Mil/uL (ref 3.87–5.11)
WBC: 6.6 10*3/uL (ref 4.5–10.5)

## 2012-09-26 LAB — TSH: TSH: 1.85 u[IU]/mL (ref 0.35–5.50)

## 2012-10-04 ENCOUNTER — Ambulatory Visit (INDEPENDENT_AMBULATORY_CARE_PROVIDER_SITE_OTHER): Payer: Medicare Other | Admitting: Family Medicine

## 2012-10-04 ENCOUNTER — Encounter: Payer: Self-pay | Admitting: Family Medicine

## 2012-10-04 VITALS — BP 142/70 | HR 64 | Temp 98.7°F | Ht 61.5 in | Wt 143.0 lb

## 2012-10-04 DIAGNOSIS — Z1231 Encounter for screening mammogram for malignant neoplasm of breast: Secondary | ICD-10-CM

## 2012-10-04 DIAGNOSIS — E785 Hyperlipidemia, unspecified: Secondary | ICD-10-CM

## 2012-10-04 DIAGNOSIS — I10 Essential (primary) hypertension: Secondary | ICD-10-CM

## 2012-10-04 MED ORDER — ATORVASTATIN CALCIUM 20 MG PO TABS: 20.0000 mg | ORAL_TABLET | Freq: Every day | ORAL | Status: DC

## 2012-10-04 MED ORDER — LISINOPRIL 10 MG PO TABS: 10.0000 mg | ORAL_TABLET | Freq: Every day | ORAL | Status: DC

## 2012-10-04 NOTE — Assessment & Plan Note (Signed)
Better on 2nd check bp in fair control at this time  No changes needed  Disc lifstyle change with low sodium diet and exercise  Labs rev  Med renewed

## 2012-10-04 NOTE — Progress Notes (Signed)
Subjective:    Patient ID: Anna Hendricks, female    DOB: [birth year], 72 y.o.   MRN: [id number]  HPI Here for health maintenance exam and to review chronic medical problems    Feels great - doing well   Flu vaccine - got that in sept   colonosc 9/12  mammo 6/11- forgets to get  Self exam-no lumps or changes   No gyn problems or issues - no hx of abn paps Monogamous   Zoster status- had that vaccine  Pneumovax- had last one after the age of 50- 5 y ago Wt is up 9 lb with bmi of 26  bp is stable today  No cp or palpitations or headaches or edema  No side effects to medicines  Is not checking outside the office - taking it makes her nervous  BP Readings from Last 3 Encounters:  10/04/12 148/90  05/14/11 148/72  04/16/11 148/87     She takes water exercise and also walks Eats a healthy diet for the most part   Falls-none at all, no fractures   Mood- overall very good - very chipper   Hyperlipidemia- lipitor Lab Results  Component Value Date   CHOL 251* 09/26/2012   CHOL 214* 05/03/2011   CHOL 314* 03/30/2011   Lab Results  Component Value Date   HDL 77.60 09/26/2012   HDL 30.86 05/03/2011   HDL 57.84 03/30/2011   No results found for this basename: [facility]   Lab Results  Component Value Date   TRIG 85.0 09/26/2012   TRIG 60.0 05/03/2011   TRIG 141.0 03/30/2011   Lab Results  Component Value Date   CHOLHDL 3 09/26/2012   CHOLHDL 2 05/03/2011   CHOLHDL 5 03/30/2011   Lab Results  Component Value Date   LDLDIRECT 151.7 09/26/2012   LDLDIRECT 110.7 05/03/2011   LDLDIRECT 223.7 03/30/2011   she occasionally forgets med  Does not eat sat fats as a rule , but does like eggs   Patient Active Problem List  Diagnosis  . Hyperlipidemia  . Special screening for malignant neoplasms, colon  . Other screening mammogram  . Hypertension   Past Medical History  Diagnosis Date  . History of chicken pox   . Arthritis   . GERD (gastroesophageal reflux disease)   .  Hyperlipidemia   . Hypertension    Past Surgical History  Procedure Laterality Date  . Appendectomy  1955  . Elbow surgery  2007  . Colonoscopy    . Dilation and curettage of uterus     History  Substance Use Topics  . Smoking status: Former Smoker    Quit date: 08/10/1987  . Smokeless tobacco: Never Used  . Alcohol Use: 2.4 oz/week    4 Glasses of wine per week     Comment: occ   Family History  Problem Relation Age of Onset  . Arthritis Mother   . Hyperlipidemia Mother   . Stroke Mother   . Hypertension Father   . Cancer Maternal Aunt     breast CA   No Known Allergies Current Outpatient Prescriptions on File Prior to Visit  Medication Sig Dispense Refill  . atorvastatin (LIPITOR) 20 MG tablet Take 1 tablet (20 mg total) by mouth daily.  30 tablet  6  . CALCIUM-VITAMIN D PO Take by mouth as directed.        . fish oil-omega-3 fatty acids 1000 MG capsule Take 600 mg by mouth daily.        Marland Kitchen  lisinopril (ZESTRIL) 10 MG tablet Take 1 tablet (10 mg total) by mouth daily.  30 tablet  0  . Multiple Vitamin (MULTIVITAMIN) tablet Take 1 tablet by mouth daily.        . vitamin C (ASCORBIC ACID) 500 MG tablet Take 500 mg by mouth daily.        . vitamin E 400 UNIT capsule Take 400 Units by mouth daily.         No current facility-administered medications on file prior to visit.      Review of Systems Review of Systems  Constitutional: Negative for fever, appetite change, fatigue and unexpected weight change.  Eyes: Negative for pain and visual disturbance.  Respiratory: Negative for cough and shortness of breath.   Cardiovascular: Negative for cp or palpitations    Gastrointestinal: Negative for nausea, diarrhea and constipation.  Genitourinary: Negative for urgency and frequency.  Skin: Negative for pallor or rash   Neurological: Negative for weakness, light-headedness, numbness and headaches.  Hematological: Negative for adenopathy. Does not bruise/bleed easily.   Psychiatric/Behavioral: Negative for dysphoric mood. The patient is not nervous/anxious.         Objective:   Physical Exam  Constitutional: She appears well-developed and well-nourished. No distress.  HENT:  Head: Normocephalic and atraumatic.  Right Ear: External ear normal.  Left Ear: External ear normal.  Nose: Nose normal.  Mouth/Throat: Oropharynx is clear and moist. No oropharyngeal exudate.  Eyes: Conjunctivae and EOM are normal. Pupils are equal, round, and reactive to light. Right eye exhibits no discharge. Left eye exhibits no discharge. No scleral icterus.  Neck: Normal range of motion. Neck supple. No JVD present. Carotid bruit is not present. No thyromegaly present.  Cardiovascular: Normal rate, regular rhythm, normal heart sounds and intact distal pulses.  Exam reveals no gallop.   Pulmonary/Chest: Effort normal and breath sounds normal. No respiratory distress. She has no wheezes. She exhibits no tenderness.  Abdominal: Soft. Bowel sounds are normal. She exhibits no distension, no abdominal bruit and no mass. There is no tenderness.  Genitourinary: No breast swelling, tenderness, discharge or bleeding.  Breast exam: No mass, nodules, thickening, tenderness, bulging, retraction, inflamation, nipple discharge or skin changes noted.  No axillary or clavicular LA.  Chaperoned exam.    Musculoskeletal: She exhibits no edema and no tenderness.  Lymphadenopathy:    She has no cervical adenopathy.  Neurological: She is alert. She has normal reflexes. No cranial nerve deficit. She exhibits normal muscle tone. Coordination normal.  Skin: Skin is warm and dry. No rash noted. No erythema. No pallor.  Psychiatric: She has a normal mood and affect.          Assessment & Plan:

## 2012-10-04 NOTE — Assessment & Plan Note (Signed)
Pt will schedule her own mam at [facility] Nl exam Enc monthly self exams

## 2012-10-04 NOTE — Assessment & Plan Note (Signed)
Cholesterol is up - poss due to missed doses of atorvastatin  Re check 3 mo Disc goals for lipids and reasons to control them Rev labs with pt Rev low sat fat diet in detail Med renewed

## 2012-10-04 NOTE — Patient Instructions (Addendum)
Do not forget to schedule your mammogram at [facility]-here is the info Change lipitor dosing to morning  Schedule fasting labs 3 months for cholesterol Avoid red meat/ fried foods/ egg yolks/ fatty breakfast meats/ butter, cheese and high fat dairy/ and shellfish

## 2012-10-24 ENCOUNTER — Other Ambulatory Visit: Payer: Medicare Other

## 2013-01-04 ENCOUNTER — Other Ambulatory Visit (INDEPENDENT_AMBULATORY_CARE_PROVIDER_SITE_OTHER): Payer: Medicare Other

## 2013-01-04 DIAGNOSIS — E785 Hyperlipidemia, unspecified: Secondary | ICD-10-CM

## 2013-01-04 LAB — LDL CHOLESTEROL, DIRECT: Direct LDL: 148.7 mg/dL

## 2013-01-04 LAB — LIPID PANEL
HDL: 84.8 mg/dL (ref >39.00)
Triglycerides: 78 mg/dL (ref 0.0–149.0)

## 2013-01-05 ENCOUNTER — Encounter: Payer: Self-pay | Admitting: *Deleted

## 2013-06-24 ENCOUNTER — Emergency Department (HOSPITAL_COMMUNITY)
Admission: EM | Admit: 2013-06-24 | Discharge: 2013-06-24 | Disposition: A | Discharge status: Home / Self Care | Payer: Medicare Other | Source: Home / Self Care

## 2013-06-24 ENCOUNTER — Encounter (HOSPITAL_COMMUNITY): Payer: Self-pay | Admitting: Emergency Medicine

## 2013-06-24 DIAGNOSIS — M109 Gout, unspecified: Secondary | ICD-10-CM

## 2013-06-24 DIAGNOSIS — M79609 Pain in unspecified limb: Secondary | ICD-10-CM

## 2013-06-24 DIAGNOSIS — M79675 Pain in left toe(s): Secondary | ICD-10-CM

## 2013-06-24 MED ORDER — TRIAMCINOLONE ACETONIDE 40 MG/ML IJ SUSP
INTRAMUSCULAR | Status: AC
  Filled 2013-06-24: qty 1

## 2013-06-24 MED ORDER — TRIAMCINOLONE ACETONIDE 40 MG/ML IJ SUSP
20.0000 mg | Freq: Once | INTRAMUSCULAR | Status: AC
  Administered 2013-06-24: 20 mg via INTRA_ARTICULAR

## 2013-06-24 MED ORDER — PREDNISONE (PAK) 10 MG PO TABS: ORAL_TABLET | ORAL | Status: DC

## 2013-06-24 NOTE — ED Notes (Signed)
Left great toe pain, onset sudden, woke patient from nap Saturday afternoon.  No known injury

## 2013-06-24 NOTE — ED Provider Notes (Signed)
CSN: [id number]     Arrival date & time 06/24/13  0906 History   None    Chief Complaint  Patient presents with  . Foot Pain   (Consider location/radiation/quality/duration/timing/severity/associated sxs/prior Treatment) HPI Comments: Patient presents today with acute onset of left great toe pain, swelling, redness and warmth. Onset 24 hours. Very painful, cannot "stand to be touched". No injury. No fever or chills are noted. No previous incidence.    Patient is a 72 y.o. female presenting with lower extremity pain.  Foot Pain    Past Medical History  Diagnosis Date  . History of chicken pox   . Arthritis   . GERD (gastroesophageal reflux disease)   . Hyperlipidemia   . Hypertension    Past Surgical History  Procedure Laterality Date  . Appendectomy  1955  . Elbow surgery  2007  . Colonoscopy    . Dilation and curettage of uterus     Family History  Problem Relation Age of Onset  . Arthritis Mother   . Hyperlipidemia Mother   . Stroke Mother   . Hypertension Father   . Cancer Maternal Aunt     breast CA   History  Substance Use Topics  . Smoking status: Former Smoker    Quit date: 08/10/1987  . Smokeless tobacco: Never Used  . Alcohol Use: 2.4 oz/week    4 Glasses of wine per week     Comment: occ   OB History   Grav Para Term Preterm Abortions TAB SAB Ect Mult Living                 Review of Systems  All other systems reviewed and are negative.    Allergies  Review of patient's allergies indicates no known allergies.  Home Medications   Current Outpatient Rx  Name  Route  Sig  Dispense  Refill  . aspirin 81 MG tablet   Oral   Take 81 mg by mouth daily.         Marland Kitchen atorvastatin (LIPITOR) 20 MG tablet   Oral   Take 1 tablet (20 mg total) by mouth daily.   90 tablet   3   . CALCIUM-VITAMIN D PO   Oral   Take by mouth as directed.           . fish oil-omega-3 fatty acids 1000 MG capsule   Oral   Take 600 mg by mouth daily.           . Flaxseed, Linseed, 1300 MG CAPS   Oral   Take 1 tablet by mouth daily.         Marland Kitchen lisinopril (ZESTRIL) 10 MG tablet   Oral   Take 1 tablet (10 mg total) by mouth daily.   90 tablet   3   . Multiple Vitamin (MULTIVITAMIN) tablet   Oral   Take 1 tablet by mouth daily.           . predniSONE (STERAPRED UNI-PAK) 10 MG tablet      12 day pack to be taken as directed   1 tablet   0   . vitamin C (ASCORBIC ACID) 500 MG tablet   Oral   Take 500 mg by mouth daily.           . vitamin E 400 UNIT capsule   Oral   Take 400 Units by mouth daily.            BP 148/62  Pulse 80  Temp(Src) 98.8 F (37.1 C) (Oral)  Resp 16 Physical Exam  Nursing note and vitals reviewed. Constitutional: She is oriented to person, place, and time. She appears well-developed and well-nourished. No distress.  HENT:  Head: Normocephalic and atraumatic.  Musculoskeletal: She exhibits edema and tenderness.  Left Great toe Podagra  Neurological: She is alert and oriented to person, place, and time.  Skin: Skin is warm and dry. She is not diaphoretic. There is erythema.  Erythematous left great toe with warmth and swelling  Psychiatric: Her behavior is normal.    ED Course  Injection of joint Date/Time: 06/24/2013 10:05 AM Performed by: Riki Sheer Authorized by: Riki Sheer Consent: Verbal consent obtained. Risks and benefits: risks, benefits and alternatives were discussed Consent given by: patient Patient understanding: patient states understanding of the procedure being performed Test results: test results available and properly labeled Patient identity confirmed: verbally with patient Preparation: Patient was prepped and draped in the usual sterile fashion. Local anesthesia used: yes Local anesthetic: lidocaine spray Patient sedated: no Patient tolerance: Patient tolerated the procedure well with no immediate complications. Comments: Injected Left 1st MTP with Kenalog  40mg .    (including critical care time) Labs Review Labs Reviewed - No data to display Imaging Review No results found.  EKG Interpretation     Ventricular Rate:    PR Interval:    QRS Duration:   QT Interval:    QTC Calculation:   R Axis:     Text Interpretation:              MDM   1. Podagra   2. Gout flare   3. Great toe pain, left    Elected to inject 1st MTP today per patient. Pred Pack given if needed in the interim. F/U with PCP for a Uric Acid as it will be lower During an acute flare. May use NSAIDs also. Education given.    Riki Sheer, PA-C 06/24/13 1007

## 2013-06-25 NOTE — ED Provider Notes (Signed)
Medical screening examination/treatment/procedure(s) were performed by resident physician or non-physician practitioner and as supervising physician I was immediately available for consultation/collaboration.   Barkley Bruns MD.   Linna Hoff, MD 06/25/13 2059

## 2013-09-04 ENCOUNTER — Encounter: Payer: Self-pay | Admitting: Family Medicine

## 2013-09-06 ENCOUNTER — Encounter: Payer: Self-pay | Admitting: Family Medicine

## 2013-09-07 ENCOUNTER — Encounter: Payer: Self-pay | Admitting: *Deleted

## 2013-10-01 ENCOUNTER — Other Ambulatory Visit: Payer: Self-pay

## 2013-10-01 MED ORDER — LISINOPRIL 10 MG PO TABS: 10.0000 mg | ORAL_TABLET | Freq: Every day | ORAL | Status: DC

## 2013-10-01 MED ORDER — ATORVASTATIN CALCIUM 20 MG PO TABS: 20.0000 mg | ORAL_TABLET | Freq: Every day | ORAL | Status: DC

## 2013-10-01 NOTE — Telephone Encounter (Signed)
Pt request refill atorvastatin and lisinopril to [facility]. Pt has CPX already scheduled for 11/27/13. Notified pt done.

## 2013-10-02 NOTE — Telephone Encounter (Signed)
Pt checking on status of refill; spoke with Elmyra Ricks at [facility] and will be ready for pick up today after 3 pm. Pt voiced understanding.

## 2013-11-19 ENCOUNTER — Telehealth: Payer: Self-pay | Admitting: Family Medicine

## 2013-11-19 DIAGNOSIS — E785 Hyperlipidemia, unspecified: Secondary | ICD-10-CM

## 2013-11-19 DIAGNOSIS — I10 Essential (primary) hypertension: Secondary | ICD-10-CM

## 2013-11-19 NOTE — Telephone Encounter (Signed)
Message copied by Abner Greenspan on Mon Nov 19, 2013  9:39 PM ------      Message from: Ellamae Sia      Created: Thu Nov 08, 2013 12:10 PM      Regarding: Lab orders for Tuesday, 4.14.15       Patient is scheduled for CPX labs, please order future labs, Thanks , Terri       ------

## 2013-11-20 ENCOUNTER — Other Ambulatory Visit (INDEPENDENT_AMBULATORY_CARE_PROVIDER_SITE_OTHER): Payer: Commercial Managed Care - HMO

## 2013-11-20 DIAGNOSIS — I10 Essential (primary) hypertension: Secondary | ICD-10-CM

## 2013-11-20 DIAGNOSIS — E785 Hyperlipidemia, unspecified: Secondary | ICD-10-CM

## 2013-11-20 LAB — CBC WITH DIFFERENTIAL/PLATELET
BASOS PCT: 0.7 % (ref 0.0–3.0)
Basophils Absolute: 0 10*3/uL (ref 0.0–0.1)
Eosinophils Absolute: 0.3 10*3/uL (ref 0.0–0.7)
Eosinophils Relative: 6.5 % — ABNORMAL HIGH (ref 0.0–5.0)
HCT: 39.7 % (ref 36.0–46.0)
Hemoglobin: 13.4 g/dL (ref 12.0–15.0)
Lymphocytes Relative: 33.8 % (ref 12.0–46.0)
Lymphs Abs: 1.7 10*3/uL (ref 0.7–4.0)
MCHC: 33.8 g/dL (ref 30.0–36.0)
MCV: 96.4 fl (ref 78.0–100.0)
MONO ABS: 0.4 10*3/uL (ref 0.1–1.0)
Monocytes Relative: 7.2 % (ref 3.0–12.0)
NEUTROS PCT: 51.8 % (ref 43.0–77.0)
Neutro Abs: 2.6 10*3/uL (ref 1.4–7.7)
Platelets: 358 10*3/uL (ref 150.0–400.0)
RBC: 4.11 Mil/uL (ref 3.87–5.11)
RDW: 14.6 % (ref 11.5–14.6)
WBC: 5 10*3/uL (ref 4.5–10.5)

## 2013-11-20 LAB — COMPREHENSIVE METABOLIC PANEL
ALBUMIN: 4.4 g/dL (ref 3.5–5.2)
ALK PHOS: 48 U/L (ref 39–117)
ALT: 20 U/L (ref 0–35)
AST: 24 U/L (ref 0–37)
BUN: 17 mg/dL (ref 6–23)
CALCIUM: 9.7 mg/dL (ref 8.4–10.5)
CHLORIDE: 107 meq/L (ref 96–112)
CO2: 25 mEq/L (ref 19–32)
Creatinine, Ser: 0.7 mg/dL (ref 0.4–1.2)
GFR: 93.19 mL/min (ref >60.00)
Glucose, Bld: 93 mg/dL (ref 70–99)
POTASSIUM: 4.5 meq/L (ref 3.5–5.1)
Sodium: 142 mEq/L (ref 135–145)
Total Bilirubin: 0.4 mg/dL (ref 0.3–1.2)
Total Protein: 7.6 g/dL (ref 6.0–8.3)

## 2013-11-20 LAB — LIPID PANEL
CHOLESTEROL: 233 mg/dL — AB (ref 0–200)
HDL: 77.9 mg/dL (ref >39.00)
LDL Cholesterol: 117 mg/dL — ABNORMAL HIGH (ref 0–99)
TRIGLYCERIDES: 193 mg/dL — AB (ref 0.0–149.0)
Total CHOL/HDL Ratio: 3
VLDL: 38.6 mg/dL (ref 0.0–40.0)

## 2013-11-20 LAB — TSH: TSH: 0.95 u[IU]/mL (ref 0.35–5.50)

## 2013-11-27 ENCOUNTER — Encounter: Payer: Self-pay | Admitting: Family Medicine

## 2013-11-27 ENCOUNTER — Ambulatory Visit (INDEPENDENT_AMBULATORY_CARE_PROVIDER_SITE_OTHER): Payer: Commercial Managed Care - HMO | Admitting: Family Medicine

## 2013-11-27 VITALS — BP 142/80 | HR 69 | Temp 98.3°F | Ht 61.5 in | Wt 148.2 lb

## 2013-11-27 DIAGNOSIS — Z23 Encounter for immunization: Secondary | ICD-10-CM

## 2013-11-27 DIAGNOSIS — Z0000 Encounter for general adult medical examination without abnormal findings: Secondary | ICD-10-CM | POA: Insufficient documentation

## 2013-11-27 DIAGNOSIS — I10 Essential (primary) hypertension: Secondary | ICD-10-CM

## 2013-11-27 DIAGNOSIS — E785 Hyperlipidemia, unspecified: Secondary | ICD-10-CM

## 2013-11-27 MED ORDER — ATORVASTATIN CALCIUM 20 MG PO TABS: 20.0000 mg | ORAL_TABLET | Freq: Every day | ORAL | Status: DC

## 2013-11-27 MED ORDER — LISINOPRIL 10 MG PO TABS: 10.0000 mg | ORAL_TABLET | Freq: Every day | ORAL | Status: DC

## 2013-11-27 NOTE — Progress Notes (Signed)
Pre visit review using our clinic review tool, if applicable. No additional management support is needed unless otherwise documented below in the visit note. 

## 2013-11-27 NOTE — Progress Notes (Signed)
Subjective:    Patient ID: Anna Hendricks, female    DOB: 08/05/1941, 73 y.o.   MRN: [id number]  HPI I have personally reviewed the Medicare Annual Wellness questionnaire and have noted 1. The patient's medical and social history 2. Their use of alcohol, tobacco or illicit drugs 3. Their current medications and supplements 4. The patient's functional ability including ADL's, fall risks, home safety risks and hearing or visual             impairment. 5. Diet and physical activities 6. Evidence for depression or mood disorders  The patients weight, height, BMI have been recorded in the chart and visual acuity is per eye clinic.  I have made referrals, counseling and provided education to the patient based review of the above and I have provided the pt with a written personalized care plan for preventive services.  See scanned forms.  Routine anticipatory guidance given to patient.  See health maintenance. Colon cancer screening 9/12  Breast cancer screening mammogram 1/15  (did a 3D mammogram)  Self breast exam -no lumps on self exam  Flu vaccine 10/14  Tetanus vaccine 1 09  Pneumovax 11/06  And she wants a prevnar today Zoster vaccine 1/08  Advance directive- does not have a living will - she is going to work on that - given a packet on it  Cognitive function addressed- see scanned forms- and if abnormal then additional documentation follows. -no problems so far   She likes to do work puzzles   She is working regularly daily and also yoga twice per week   PMH and SH reviewed  Meds, vitals, and allergies reviewed.   ROS: See HPI.  Otherwise negative.    bp is stable today  No cp or palpitations or headaches or edema  No side effects to medicines  BP Readings from Last 3 Encounters:  11/27/13 148/86  06/24/13 148/62  10/04/12 142/70     She gets nervous with bp checks -will not do them at home     Chemistry      Component Value Date/Time   NA 142 11/20/2013 0830   K  4.5 11/20/2013 0830   CL 107 11/20/2013 0830   CO2 25 11/20/2013 0830   BUN 17 11/20/2013 0830   CREATININE 0.7 11/20/2013 0830      Component Value Date/Time   CALCIUM 9.7 11/20/2013 0830   ALKPHOS 48 11/20/2013 0830   AST 24 11/20/2013 0830   ALT 20 11/20/2013 0830   BILITOT 0.4 11/20/2013 0830       Hyperlipidemia Lab Results  Component Value Date   CHOL 233* 11/20/2013   CHOL 240* 01/04/2013   CHOL 251* 09/26/2012   Lab Results  Component Value Date   HDL 77.90 11/20/2013   HDL 84.80 01/04/2013   HDL 77.60 09/26/2012   Lab Results  Component Value Date   LDLCALC 117* 11/20/2013   Lab Results  Component Value Date   TRIG 193.0* 11/20/2013   TRIG 78.0 01/04/2013   TRIG 85.0 09/26/2012   Lab Results  Component Value Date   CHOLHDL 3 11/20/2013   CHOLHDL 3 01/04/2013   CHOLHDL 3 09/26/2012   Lab Results  Component Value Date   LDLDIRECT 148.7 01/04/2013   LDLDIRECT 151.7 09/26/2012   LDLDIRECT 110.7 05/03/2011   in general - cholesterol is improved with lipitor and diet   Other labs are ok   Patient Active Problem List   Diagnosis Date Noted  . Encounter  for Medicare annual wellness exam 11/27/2013  . Hypertension 04/02/2011  . Hyperlipidemia 03/02/2011  . Special screening for malignant neoplasms, colon 03/02/2011  . Other screening mammogram 03/02/2011   Past Medical History  Diagnosis Date  . History of chicken pox   . Arthritis   . GERD (gastroesophageal reflux disease)   . Hyperlipidemia   . Hypertension    Past Surgical History  Procedure Laterality Date  . Appendectomy  1955  . Elbow surgery  2007  . Colonoscopy    . Dilation and curettage of uterus     History  Substance Use Topics  . Smoking status: Former Smoker    Quit date: 08/10/1987  . Smokeless tobacco: Never Used  . Alcohol Use: 2.4 oz/week    4 Glasses of wine per week     Comment: occ   Family History  Problem Relation Age of Onset  . Arthritis Mother   . Hyperlipidemia Mother   .  Stroke Mother   . Hypertension Father   . Cancer Maternal Aunt     breast CA   No Known Allergies Current Outpatient Prescriptions on File Prior to Visit  Medication Sig Dispense Refill  . aspirin 81 MG tablet Take 81 mg by mouth daily.      Marland Kitchen CALCIUM-VITAMIN D PO Take by mouth as directed.        . Multiple Vitamin (MULTIVITAMIN) tablet Take 1 tablet by mouth daily.        . vitamin C (ASCORBIC ACID) 500 MG tablet Take 500 mg by mouth daily.         No current facility-administered medications on file prior to visit.     Review of Systems    Review of Systems  Constitutional: Negative for fever, appetite change, fatigue and unexpected weight change.  Eyes: Negative for pain and visual disturbance.  Respiratory: Negative for cough and shortness of breath.   Cardiovascular: Negative for cp or palpitations    Gastrointestinal: Negative for nausea, diarrhea and constipation.  Genitourinary: Negative for urgency and frequency.  Skin: Negative for pallor or rash   Neurological: Negative for weakness, light-headedness, numbness and headaches.  Hematological: Negative for adenopathy. Does not bruise/bleed easily.  Psychiatric/Behavioral: Negative for dysphoric mood. The patient is not nervous/anxious.      Objective:   Physical Exam  Constitutional: She appears well-developed and well-nourished. No distress.  HENT:  Head: Normocephalic and atraumatic.  Right Ear: External ear normal.  Left Ear: External ear normal.  Nose: Nose normal.  Mouth/Throat: Oropharynx is clear and moist.  Eyes: Conjunctivae and EOM are normal. Pupils are equal, round, and reactive to light. Right eye exhibits no discharge. Left eye exhibits no discharge. No scleral icterus.  Neck: Normal range of motion. Neck supple. No JVD present. No thyromegaly present.  Cardiovascular: Normal rate, regular rhythm, normal heart sounds and intact distal pulses.  Exam reveals no gallop.   Pulmonary/Chest: Effort normal  and breath sounds normal. No respiratory distress. She has no wheezes. She has no rales.  Abdominal: Soft. Bowel sounds are normal. She exhibits no distension and no mass. There is no tenderness.  Genitourinary: No breast swelling, tenderness, discharge or bleeding.  Breast exam: No mass, nodules, thickening, tenderness, bulging, retraction, inflamation, nipple discharge or skin changes noted.  No axillary or clavicular LA.      Musculoskeletal: She exhibits no edema and no tenderness.  Lymphadenopathy:    She has no cervical adenopathy.  Neurological: She is alert. She has normal  reflexes. No cranial nerve deficit. She exhibits normal muscle tone. Coordination normal.  Skin: Skin is warm and dry. No rash noted. No erythema. No pallor.  Psychiatric: She has a normal mood and affect.          Assessment & Plan:

## 2013-11-27 NOTE — Patient Instructions (Signed)
prevnar pneumonia vaccine today  Keep working on healthy diet and exercise - stay active  Blood pressure and labs are stable

## 2013-11-29 NOTE — Assessment & Plan Note (Signed)
Disc goals for lipids and reasons to control them Rev labs with pt Rev low sat fat diet in detail Imp with statin and diet

## 2013-11-29 NOTE — Assessment & Plan Note (Signed)
bp in fair control at this time  BP Readings from Last 1 Encounters:  11/27/13 142/80   No changes needed Disc lifstyle change with low sodium diet and exercise  Lab rev

## 2013-11-29 NOTE — Assessment & Plan Note (Signed)
Reviewed health habits including diet and exercise and skin cancer prevention Reviewed appropriate screening tests for age  Also reviewed health mt list, fam hx and immunization status , as well as social and family history   See HPI Labs reviewed  

## 2014-12-31 ENCOUNTER — Ambulatory Visit (INDEPENDENT_AMBULATORY_CARE_PROVIDER_SITE_OTHER): Payer: Medicare HMO | Admitting: Family Medicine

## 2014-12-31 ENCOUNTER — Encounter: Payer: Self-pay | Admitting: Family Medicine

## 2014-12-31 VITALS — BP 140/80 | HR 74 | Temp 98.0°F | Ht 61.5 in | Wt 152.8 lb

## 2014-12-31 DIAGNOSIS — I10 Essential (primary) hypertension: Secondary | ICD-10-CM

## 2014-12-31 DIAGNOSIS — E785 Hyperlipidemia, unspecified: Secondary | ICD-10-CM | POA: Diagnosis not present

## 2014-12-31 LAB — COMPREHENSIVE METABOLIC PANEL
ALBUMIN: 4.4 g/dL (ref 3.5–5.2)
ALT: 14 U/L (ref 0–35)
AST: 20 U/L (ref 0–37)
Alkaline Phosphatase: 59 U/L (ref 39–117)
BUN: 18 mg/dL (ref 6–23)
CHLORIDE: 102 meq/L (ref 96–112)
CO2: 29 mEq/L (ref 19–32)
CREATININE: 0.69 mg/dL (ref 0.40–1.20)
Calcium: 9.8 mg/dL (ref 8.4–10.5)
GFR: 88.26 mL/min (ref >60.00)
GLUCOSE: 99 mg/dL (ref 70–99)
POTASSIUM: 4.4 meq/L (ref 3.5–5.1)
Sodium: 136 mEq/L (ref 135–145)
Total Bilirubin: 0.6 mg/dL (ref 0.2–1.2)
Total Protein: 7.7 g/dL (ref 6.0–8.3)

## 2014-12-31 LAB — LIPID PANEL
CHOL/HDL RATIO: 3
Cholesterol: 221 mg/dL — ABNORMAL HIGH (ref 0–200)
HDL: 75.5 mg/dL (ref >39.00)
LDL Cholesterol: 119 mg/dL — ABNORMAL HIGH (ref 0–99)
NonHDL: 145.5
Triglycerides: 134 mg/dL (ref 0.0–149.0)
VLDL: 26.8 mg/dL (ref 0.0–40.0)

## 2014-12-31 MED ORDER — ATORVASTATIN CALCIUM 20 MG PO TABS: 20.0000 mg | ORAL_TABLET | Freq: Every day | ORAL | Status: DC

## 2014-12-31 MED ORDER — LISINOPRIL 10 MG PO TABS: 10.0000 mg | ORAL_TABLET | Freq: Every day | ORAL | Status: DC

## 2014-12-31 NOTE — Assessment & Plan Note (Signed)
Rev low sat fat diet - not doing as well  Lab today  Disc goals for cholesterol Continues atorvastatin

## 2014-12-31 NOTE — Progress Notes (Signed)
Pre visit review using our clinic review tool, if applicable. No additional management support is needed unless otherwise documented below in the visit note. 

## 2014-12-31 NOTE — Progress Notes (Signed)
Subjective:    Patient ID: Anna Hendricks, female    DOB: 1940/11/14, 74 y.o.   MRN: [id number]  HPI  Here for f/u of chronic medical problems   Has been feeling good   Wt is up 4 lb with bmi of 28  bp is up today-- is lower when not in the office - but she gets nervous to check  No cp or palpitations or headaches or edema  No side effects to medicines  BP Readings from Last 3 Encounters:  12/31/14 168/80  11/27/13 142/80  06/24/13 148/62    Took her medicines today   Hyperlipidemia On lipitor Lab Results  Component Value Date   CHOL 233* 11/20/2013   HDL 77.90 11/20/2013   LDLCALC 117* 11/20/2013   LDLDIRECT 148.7 01/04/2013   TRIG 193.0* 11/20/2013   CHOLHDL 3 11/20/2013     Due for labs- ate an english muffin today   Wt is up 4 lb with bmi of 20  Takes swim and yoga classes and walks daily  Has a new dog - walks it frequently  Diet is "terrible" however - she eats for boredom    Has PE upcoming in dec    Patient Active Problem List   Diagnosis Date Noted  . Encounter for Medicare annual wellness exam 11/27/2013  . Hypertension 04/02/2011  . Hyperlipidemia 03/02/2011  . Special screening for malignant neoplasms, colon 03/02/2011  . Other screening mammogram 03/02/2011   Past Medical History  Diagnosis Date  . History of chicken pox   . Arthritis   . GERD (gastroesophageal reflux disease)   . Hyperlipidemia   . Hypertension    Past Surgical History  Procedure Laterality Date  . Appendectomy  1955  . Elbow surgery  2007  . Colonoscopy    . Dilation and curettage of uterus     History  Substance Use Topics  . Smoking status: Former Smoker    Quit date: 08/10/1987  . Smokeless tobacco: Never Used  . Alcohol Use: 2.4 oz/week    4 Glasses of wine per week     Comment: occ   Family History  Problem Relation Age of Onset  . Arthritis Mother   . Hyperlipidemia Mother   . Stroke Mother   . Hypertension Father   . Cancer Maternal Aunt    breast CA   No Known Allergies Current Outpatient Prescriptions on File Prior to Visit  Medication Sig Dispense Refill  . aspirin 81 MG tablet Take 81 mg by mouth daily.    Marland Kitchen CALCIUM-VITAMIN D PO Take by mouth as directed.      . Cholecalciferol (VITAMIN D PO) Take 1 tablet by mouth daily.    . Multiple Vitamin (MULTIVITAMIN) tablet Take 1 tablet by mouth daily.      . vitamin C (ASCORBIC ACID) 500 MG tablet Take 500 mg by mouth daily.      Marland Kitchen atorvastatin (LIPITOR) 20 MG tablet Take 1 tablet (20 mg total) by mouth daily. 90 tablet 3  . lisinopril (ZESTRIL) 10 MG tablet Take 1 tablet (10 mg total) by mouth daily. 90 tablet 3   No current facility-administered medications on file prior to visit.      Review of Systems Review of Systems  Constitutional: Negative for fever, appetite change, fatigue and unexpected weight change.  Eyes: Negative for pain and visual disturbance.  Respiratory: Negative for cough and shortness of breath.   Cardiovascular: Negative for cp or palpitations    Gastrointestinal:  Negative for nausea, diarrhea and constipation.  Genitourinary: Negative for urgency and frequency.  Skin: Negative for pallor or rash   Neurological: Negative for weakness, light-headedness, numbness and headaches.  Hematological: Negative for adenopathy. Does not bruise/bleed easily.  Psychiatric/Behavioral: Negative for dysphoric mood. The patient is not nervous/anxious.         Objective:   Physical Exam  Constitutional: She appears well-developed and well-nourished. No distress.  overwt and well app  HENT:  Head: Normocephalic and atraumatic.  Mouth/Throat: Oropharynx is clear and moist.  Eyes: Conjunctivae and EOM are normal. Pupils are equal, round, and reactive to light.  Neck: Normal range of motion. Neck supple. No JVD present. Carotid bruit is not present. No thyromegaly present.  Cardiovascular: Normal rate, regular rhythm, normal heart sounds and intact distal pulses.   Exam reveals no gallop.   Pulmonary/Chest: Effort normal and breath sounds normal. No respiratory distress. She has no wheezes. She has no rales.  No crackles  Abdominal: Soft. Bowel sounds are normal. She exhibits no distension, no abdominal bruit and no mass. There is no tenderness.  Musculoskeletal: She exhibits no edema.  Lymphadenopathy:    She has no cervical adenopathy.  Neurological: She is alert. She has normal reflexes.  Skin: Skin is warm and dry. No rash noted.  Psychiatric: She has a normal mood and affect.          Assessment & Plan:   Problem List Items Addressed This Visit    Hyperlipidemia    Rev low sat fat diet - not doing as well  Lab today  Disc goals for cholesterol Continues atorvastatin       Relevant Medications   atorvastatin (LIPITOR) 20 MG tablet   lisinopril (ZESTRIL) 10 MG tablet   Other Relevant Orders   Lipid panel (Completed)   Hypertension - Primary    With some whitecoat component  Improved on 2nd check today bp in fair control at this time  BP Readings from Last 1 Encounters:  12/31/14 140/80   No changes needed Disc lifstyle change with low sodium diet and exercise   Lab today       Relevant Medications   atorvastatin (LIPITOR) 20 MG tablet   lisinopril (ZESTRIL) 10 MG tablet   Other Relevant Orders   Comprehensive metabolic panel (Completed)   Lipid panel (Completed)

## 2014-12-31 NOTE — Assessment & Plan Note (Signed)
With some whitecoat component  Improved on 2nd check today bp in fair control at this time  BP Readings from Last 1 Encounters:  12/31/14 140/80   No changes needed Disc lifstyle change with low sodium diet and exercise   Lab today

## 2014-12-31 NOTE — Patient Instructions (Signed)
Labs today  Work on [employer] and exercise - eat healthier foods and cut portions for weight loss  Drink lots of water  Follow up BP was 140/80

## 2015-01-01 ENCOUNTER — Encounter: Payer: Self-pay | Admitting: *Deleted

## 2015-03-21 LAB — HM MAMMOGRAPHY: HM Mammogram: NORMAL

## 2015-03-25 ENCOUNTER — Encounter: Payer: Self-pay | Admitting: Family Medicine

## 2015-03-26 ENCOUNTER — Encounter: Payer: Self-pay | Admitting: Family Medicine

## 2015-03-26 ENCOUNTER — Encounter: Payer: Self-pay | Admitting: *Deleted

## 2015-05-27 ENCOUNTER — Encounter: Payer: Self-pay | Admitting: Family Medicine

## 2015-05-27 ENCOUNTER — Ambulatory Visit (INDEPENDENT_AMBULATORY_CARE_PROVIDER_SITE_OTHER): Payer: Medicare HMO | Admitting: Family Medicine

## 2015-05-27 VITALS — BP 142/82 | HR 83 | Temp 98.2°F | Ht 61.5 in | Wt 152.0 lb

## 2015-05-27 DIAGNOSIS — R1032 Left lower quadrant pain: Secondary | ICD-10-CM | POA: Insufficient documentation

## 2015-05-27 DIAGNOSIS — R350 Frequency of micturition: Secondary | ICD-10-CM | POA: Diagnosis not present

## 2015-05-27 DIAGNOSIS — R300 Dysuria: Secondary | ICD-10-CM | POA: Diagnosis not present

## 2015-05-27 LAB — POCT URINALYSIS DIPSTICK
Bilirubin, UA: NEGATIVE
GLUCOSE UA: NEGATIVE
Nitrite, UA: NEGATIVE
Spec Grav, UA: 1.025
UROBILINOGEN UA: 0.2
pH, UA: 6

## 2015-05-27 LAB — CBC WITH DIFFERENTIAL/PLATELET
BASOS PCT: 0.1 % (ref 0.0–3.0)
Basophils Absolute: 0 10*3/uL (ref 0.0–0.1)
EOS ABS: 0.1 10*3/uL (ref 0.0–0.7)
Eosinophils Relative: 0.7 % (ref 0.0–5.0)
HEMATOCRIT: 39.7 % (ref 36.0–46.0)
Hemoglobin: 13.3 g/dL (ref 12.0–15.0)
LYMPHS PCT: 17.6 % (ref 12.0–46.0)
Lymphs Abs: 1.9 10*3/uL (ref 0.7–4.0)
MCHC: 33.4 g/dL (ref 30.0–36.0)
MCV: 96.7 fl (ref 78.0–100.0)
Monocytes Absolute: 0.9 10*3/uL (ref 0.1–1.0)
Monocytes Relative: 8.4 % (ref 3.0–12.0)
Neutro Abs: 7.9 10*3/uL — ABNORMAL HIGH (ref 1.4–7.7)
Neutrophils Relative %: 73.2 % (ref 43.0–77.0)
Platelets: 318 10*3/uL (ref 150.0–400.0)
RBC: 4.11 Mil/uL (ref 3.87–5.11)
RDW: 14.3 % (ref 11.5–15.5)
WBC: 10.7 10*3/uL — AB (ref 4.0–10.5)

## 2015-05-27 LAB — POCT UA - MICROSCOPIC ONLY: RBC, URINE, MICROSCOPIC: 0

## 2015-05-27 MED ORDER — CIPROFLOXACIN HCL 500 MG PO TABS: 500.0000 mg | ORAL_TABLET | Freq: Two times a day (BID) | ORAL | Status: DC

## 2015-05-27 NOTE — Assessment & Plan Note (Signed)
Along with some urinary symptoms  Concern for diverticulitis  Started cipro and will add flagyl if necessary Urine cx pending  Cbc sent   Enc to stop nuts/seeds/corn  Inc fluids update

## 2015-05-27 NOTE — Progress Notes (Signed)
Pre visit review using our clinic review tool, if applicable. No additional management support is needed unless otherwise documented below in the visit note. 

## 2015-05-27 NOTE — Assessment & Plan Note (Signed)
ua is borderline- sent for cx  Also having some symptoms of low abd pain (esp L)- concerning for diverticular origin  Started on cipro 500 bid and also cbc today  Pending results for further plan Enc fluids/water  Will update if worsening symptoms

## 2015-05-27 NOTE — Patient Instructions (Signed)
You may have a uti -unsure (I sent your urine for culture)-will update when it returns Start the cipro Lab today for cbc -to see if wbc is elevated  I am also concerned about possible diverticulitis - this would require adding another antibiotic called flagyl   Depending on results we may do that   Update if not starting to improve in a week or if worsening   Also if blood in stool or fever   Stop nuts/seeds/corn and popcorn for now  Also drink water and avoid the crystal light

## 2015-05-27 NOTE — Progress Notes (Signed)
Subjective:    Patient ID: Anna Hendricks, female    DOB: Nov 15, 1940, 74 y.o.   MRN: [id number]  HPI Here with some urinary symptoms   More low back pain lately  Was constipated last week Now low abdomen is crampy and generally sore   Frequent urination  Up every 1 hour this am  No burning  Some bladder discomfort to strain No blood in her urine or stool No odor to urine   Some urgency but can hold it    No fever or nausea  Has diverticulosis - never had diverticulitis   Pain is worse on L   Results for orders placed or performed in visit on 05/27/15  POCT urinalysis dipstick  Result Value Ref Range   Color, UA Yellow    Clarity, UA Hazy    Glucose, UA Neg.    Bilirubin, UA Neg.    Ketones, UA Trace    Spec Grav, UA 1.025    Blood, UA Moderate    pH, UA 6.0    Protein, UA Trace    Urobilinogen, UA 0.2    Nitrite, UA Neg.    Leukocytes, UA small (1+) (A) Negative      Patient Active Problem List   Diagnosis Date Noted  . Encounter for Medicare annual wellness exam 11/27/2013  . Hypertension 04/02/2011  . Hyperlipidemia 03/02/2011  . Special screening for malignant neoplasms, colon 03/02/2011  . Other screening mammogram 03/02/2011   Past Medical History  Diagnosis Date  . History of chicken pox   . Arthritis   . GERD (gastroesophageal reflux disease)   . Hyperlipidemia   . Hypertension    Past Surgical History  Procedure Laterality Date  . Appendectomy  1955  . Elbow surgery  2007  . Colonoscopy    . Dilation and curettage of uterus     Social History  Substance Use Topics  . Smoking status: Former Smoker    Quit date: 08/10/1987  . Smokeless tobacco: Never Used  . Alcohol Use: 2.4 oz/week    4 Glasses of wine per week     Comment: occ   Family History  Problem Relation Age of Onset  . Arthritis Mother   . Hyperlipidemia Mother   . Stroke Mother   . Hypertension Father   . Cancer Maternal Aunt     breast CA   No Known  Allergies Current Outpatient Prescriptions on File Prior to Visit  Medication Sig Dispense Refill  . aspirin 81 MG tablet Take 81 mg by mouth daily.    Marland Kitchen atorvastatin (LIPITOR) 20 MG tablet Take 1 tablet (20 mg total) by mouth daily. 90 tablet 3  . CALCIUM-VITAMIN D PO Take by mouth as directed.      . Cholecalciferol (VITAMIN D PO) Take 1 tablet by mouth daily.    Marland Kitchen lisinopril (ZESTRIL) 10 MG tablet Take 1 tablet (10 mg total) by mouth daily. 90 tablet 3  . Multiple Vitamin (MULTIVITAMIN) tablet Take 1 tablet by mouth daily.      . vitamin C (ASCORBIC ACID) 500 MG tablet Take 500 mg by mouth daily.       No current facility-administered medications on file prior to visit.    Review of Systems Review of Systems  Constitutional: Negative for fever, appetite change,  and unexpected weight change.  Eyes: Negative for pain and visual disturbance.  Respiratory: Negative for cough and shortness of breath.   Cardiovascular: Negative for cp or palpitations  Gastrointestinal: Negative for nausea, diarrhea and constipation. pos for LLQ abd pain  Genitourinary: Negative for urgency and pos for  Frequency.  Neg for hematuria  Skin: Negative for pallor or rash   Neurological: Negative for weakness, light-headedness, numbness and headaches.  Hematological: Negative for adenopathy. Does not bruise/bleed easily.  Psychiatric/Behavioral: Negative for dysphoric mood. The patient is not nervous/anxious.         Objective:   Physical Exam  Constitutional: She appears well-developed and well-nourished. No distress.  overwt and   HENT:  Head: Normocephalic and atraumatic.  Eyes: Conjunctivae and EOM are normal. Pupils are equal, round, and reactive to light.  Neck: Normal range of motion. Neck supple.  Cardiovascular: Normal rate, regular rhythm and normal heart sounds.   Pulmonary/Chest: Effort normal and breath sounds normal.  Abdominal: Soft. Bowel sounds are normal. She exhibits no distension.  There is no hepatosplenomegaly. There is tenderness in the right lower quadrant, suprapubic area and left lower quadrant. There is no rigidity, no rebound, no guarding, no tenderness at McBurney's point and negative Murphy's sign.  No cva tenderness  Mild suprapubic tenderness LLQ is most tender area   Musculoskeletal: She exhibits no edema.  Lymphadenopathy:    She has no cervical adenopathy.  Neurological: She is alert. She has normal reflexes.  Skin: Skin is warm and dry. No rash noted. No pallor.  Psychiatric: She has a normal mood and affect.          Assessment & Plan:   Problem List Items Addressed This Visit      Other   Abdominal pain, left lower quadrant    Along with some urinary symptoms  Concern for diverticulitis  Started cipro and will add flagyl if necessary Urine cx pending  Cbc sent   Enc to stop nuts/seeds/corn  Inc fluids update      Relevant Orders   CBC with Differential/Platelet   Urinary frequency - Primary    ua is borderline- sent for cx  Also having some symptoms of low abd pain (esp L)- concerning for diverticular origin  Started on cipro 500 bid and also cbc today  Pending results for further plan Enc fluids/water  Will update if worsening symptoms       Relevant Orders   Urine culture   POCT UA - Microscopic Only    Other Visit Diagnoses    Dysuria        Relevant Orders    POCT urinalysis dipstick (Completed)

## 2015-05-28 LAB — URINE CULTURE
Colony Count: NO GROWTH
Organism ID, Bacteria: NO GROWTH

## 2015-05-30 ENCOUNTER — Telehealth: Payer: Self-pay | Admitting: *Deleted

## 2015-05-30 MED ORDER — METRONIDAZOLE 500 MG PO TABS: 500.0000 mg | ORAL_TABLET | Freq: Three times a day (TID) | ORAL | Status: DC

## 2015-05-30 NOTE — Telephone Encounter (Signed)
-----   Message from Abner Greenspan, MD sent at 05/29/2015  9:43 AM EDT ----- Urine culture is negative so I want to add flagyl for suspected diverticulitis  Please call in flagyl 500 mg 1 po tid for 7 days #[phone] ref  Please let me know how she is feeling

## 2015-05-30 NOTE — Telephone Encounter (Signed)
Thanks - hope this helps more /keep me posted

## 2015-05-30 NOTE — Telephone Encounter (Signed)
Pt notified of urine cx results and Dr. Marliss Coots comments. Rx sent to pharmacy, pt said she is feeling a little better but not much so she will start the flagyl and update [place] if sxs don't resolve

## 2015-07-29 ENCOUNTER — Encounter: Payer: Commercial Managed Care - HMO | Admitting: Family Medicine

## 2015-09-09 DIAGNOSIS — H2513 Age-related nuclear cataract, bilateral: Secondary | ICD-10-CM | POA: Diagnosis not present

## 2015-12-30 ENCOUNTER — Other Ambulatory Visit: Payer: Self-pay | Admitting: Family Medicine

## 2016-01-01 NOTE — Telephone Encounter (Signed)
Pt left v/m requesting status of lisinopril and atorvastatin; I called and pt not available, [name] (DPR signed) was advised refill done on 12/30/15 and I spoke with AJ at [facility] and rx is ready for pick up. [name] voiced understanding and will advise pt.

## 2016-01-06 DIAGNOSIS — H25813 Combined forms of age-related cataract, bilateral: Secondary | ICD-10-CM | POA: Diagnosis not present

## 2016-01-13 DIAGNOSIS — H25811 Combined forms of age-related cataract, right eye: Secondary | ICD-10-CM | POA: Diagnosis not present

## 2016-01-13 DIAGNOSIS — H25812 Combined forms of age-related cataract, left eye: Secondary | ICD-10-CM | POA: Diagnosis not present

## 2016-01-21 DIAGNOSIS — H269 Unspecified cataract: Secondary | ICD-10-CM | POA: Diagnosis not present

## 2016-01-21 DIAGNOSIS — H2512 Age-related nuclear cataract, left eye: Secondary | ICD-10-CM | POA: Diagnosis not present

## 2016-02-12 DIAGNOSIS — H2511 Age-related nuclear cataract, right eye: Secondary | ICD-10-CM | POA: Diagnosis not present

## 2016-02-18 DIAGNOSIS — H25811 Combined forms of age-related cataract, right eye: Secondary | ICD-10-CM | POA: Diagnosis not present

## 2016-02-18 DIAGNOSIS — H2511 Age-related nuclear cataract, right eye: Secondary | ICD-10-CM | POA: Diagnosis not present

## 2016-04-16 DIAGNOSIS — M5136 Other intervertebral disc degeneration, lumbar region: Secondary | ICD-10-CM | POA: Diagnosis not present

## 2016-05-11 ENCOUNTER — Ambulatory Visit (INDEPENDENT_AMBULATORY_CARE_PROVIDER_SITE_OTHER): Payer: PPO | Admitting: Family Medicine

## 2016-05-11 ENCOUNTER — Encounter: Payer: Self-pay | Admitting: Family Medicine

## 2016-05-11 VITALS — BP 156/82 | HR 65 | Temp 97.8°F | Ht 61.75 in | Wt 152.2 lb

## 2016-05-11 DIAGNOSIS — I10 Essential (primary) hypertension: Secondary | ICD-10-CM

## 2016-05-11 DIAGNOSIS — E2839 Other primary ovarian failure: Secondary | ICD-10-CM | POA: Diagnosis not present

## 2016-05-11 DIAGNOSIS — E782 Mixed hyperlipidemia: Secondary | ICD-10-CM | POA: Diagnosis not present

## 2016-05-11 DIAGNOSIS — Z0000 Encounter for general adult medical examination without abnormal findings: Secondary | ICD-10-CM

## 2016-05-11 DIAGNOSIS — Z23 Encounter for immunization: Secondary | ICD-10-CM

## 2016-05-11 MED ORDER — LISINOPRIL 10 MG PO TABS: 10.0000 mg | ORAL_TABLET | Freq: Every day | ORAL | 3 refills | Status: DC

## 2016-05-11 MED ORDER — ATORVASTATIN CALCIUM 20 MG PO TABS: 20.0000 mg | ORAL_TABLET | Freq: Every day | ORAL | 3 refills | Status: DC

## 2016-05-11 MED ORDER — PNEUMOCOCCAL VAC POLYVALENT 25 MCG/0.5ML IJ INJ
0.5000 mL | INJECTION | Freq: Once | INTRAMUSCULAR | Status: AC
  Administered 2016-05-11: 0.5 mL via INTRAMUSCULAR

## 2016-05-11 NOTE — Patient Instructions (Addendum)
Don't forget to get your mammogram  Stop up front for referral for bone density test  Your blood pressure is high  Start checking your blood pressure at home- every other day- different times when relaxed Follow up with me in 2-3 weeks with blood pressure cuff and your log of readings and we will make a plan  Avoid processed foods  Flu shot and pneumonia shot today  Your have wax impaction in left ear- use your wax dissolving agent

## 2016-05-11 NOTE — Progress Notes (Signed)
Subjective:    Patient ID: Anna Hendricks, female    DOB: 11-Jul-1941, 75 y.o.   MRN: [id number]  HPI Here for health maintenance exam and to review chronic medical problems    Having some back problems-will be seeing Dr Nelva Bush soon for that   Has not had AMW yet   Due for PNA 23- will get today Flu vaccine - will get today   Mammogram 8/16-normal- already has it scheduled Self exam -no lumps  Td 1/09  Colonoscopy 9/12- ok  She wants to do cologuard   dexa 4/12-normal /gyn No falls or broken bones since then  Takes ca and vit D In yoga and water class- taking a break for her back problem   Zoster vaccines 1/08  Wt Readings from Last 3 Encounters:  05/11/16 152 lb 4 oz (69.1 kg)  05/27/15 152 lb (68.9 kg)  12/31/14 152 lb 12 oz (69.3 kg)  stable - mt her wt    bp is up  today (whitecoat) -it is lower at home  BP Readings from Last 3 Encounters:  05/11/16 (!) 156/82  05/27/15 (!) 142/82  12/31/14 140/80     Hx of hyperlipidemia  Lab Results  Component Value Date   CHOL 221 (H) 12/31/2014   CHOL 233 (H) 11/20/2013   CHOL 240 (H) 01/04/2013   Lab Results  Component Value Date   HDL 75.50 12/31/2014   HDL 77.90 11/20/2013   HDL 84.80 01/04/2013   Lab Results  Component Value Date   LDLCALC 119 (H) 12/31/2014   LDLCALC 117 (H) 11/20/2013   Lab Results  Component Value Date   TRIG 134.0 12/31/2014   TRIG 193.0 (H) 11/20/2013   TRIG 78.0 01/04/2013   Lab Results  Component Value Date   CHOLHDL 3 12/31/2014   CHOLHDL 3 11/20/2013   CHOLHDL 3 01/04/2013   Lab Results  Component Value Date   LDLDIRECT 148.7 01/04/2013   LDLDIRECT 151.7 09/26/2012   LDLDIRECT 110.7 05/03/2011  on atorvastatin  Overall stable  Has been eating about the same  Due for labs  Exercise was good until recently   Due for labs   Patient Active Problem List   Diagnosis Date Noted  . Estrogen deficiency 05/11/2016  . Routine general medical examination at a health  care facility 05/11/2016  . Abdominal pain, left lower quadrant 05/27/2015  . Urinary frequency 05/27/2015  . Encounter for Medicare annual wellness exam 11/27/2013  . Hypertension 04/02/2011  . Hyperlipidemia 03/02/2011  . Colon cancer screening 03/02/2011  . Other screening mammogram 03/02/2011   Past Medical History:  Diagnosis Date  . Arthritis   . GERD (gastroesophageal reflux disease)   . History of chicken pox   . Hyperlipidemia   . Hypertension    Past Surgical History:  Procedure Laterality Date  . APPENDECTOMY  1955  . COLONOSCOPY    . DILATION AND CURETTAGE OF UTERUS    . ELBOW SURGERY  2007   Social History  Substance Use Topics  . Smoking status: Former Smoker    Quit date: 08/10/1987  . Smokeless tobacco: Never Used  . Alcohol use 2.4 oz/week    4 Glasses of wine per week     Comment: rare   Family History  Problem Relation Age of Onset  . Arthritis Mother   . Hyperlipidemia Mother   . Stroke Mother   . Hypertension Father   . Cancer Maternal Aunt     breast CA  No Known Allergies Current Outpatient Prescriptions on File Prior to Visit  Medication Sig Dispense Refill  . aspirin 81 MG tablet Take 81 mg by mouth daily.    Marland Kitchen CALCIUM-VITAMIN D PO Take by mouth as directed.      . Cholecalciferol (VITAMIN D PO) Take 1 tablet by mouth daily.    . Multiple Vitamin (MULTIVITAMIN) tablet Take 1 tablet by mouth daily.      . vitamin C (ASCORBIC ACID) 500 MG tablet Take 500 mg by mouth daily.       No current facility-administered medications on file prior to visit.      Review of Systems    Review of Systems  Constitutional: Negative for fever, appetite change, fatigue and unexpected weight change.  Eyes: Negative for pain and visual disturbance.  Respiratory: Negative for cough and shortness of breath.   Cardiovascular: Negative for cp or palpitations    Gastrointestinal: Negative for nausea, diarrhea and constipation.  Genitourinary: Negative for  urgency and frequency.  Skin: Negative for pallor or rash   Neurological: Negative for weakness, light-headedness, numbness and headaches.  Hematological: Negative for adenopathy. Does not bruise/bleed easily.  Psychiatric/Behavioral: Negative for dysphoric mood. The patient is not nervous/anxious.      Objective:   Physical Exam  Constitutional: She appears well-developed and well-nourished. No distress.  overwt and well appearing   HENT:  Head: Normocephalic and atraumatic.  Right Ear: External ear normal.  Left Ear: External ear normal.  Mouth/Throat: Oropharynx is clear and moist.  L ear canal has a partial cerumen impaction, R canal and TM clear   Eyes: Conjunctivae and EOM are normal. Pupils are equal, round, and reactive to light. No scleral icterus.  Neck: Normal range of motion. Neck supple. No JVD present. Carotid bruit is not present. No thyromegaly present.  Cardiovascular: Normal rate, regular rhythm, normal heart sounds and intact distal pulses.  Exam reveals no gallop.   Pulmonary/Chest: Effort normal and breath sounds normal. No respiratory distress. She has no wheezes. She exhibits no tenderness.  Abdominal: Soft. Bowel sounds are normal. She exhibits no distension, no abdominal bruit and no mass. There is no tenderness.  Genitourinary: No breast swelling, tenderness, discharge or bleeding.  Genitourinary Comments: Breast exam: No mass, nodules, thickening, tenderness, bulging, retraction, inflamation, nipple discharge or skin changes noted.  No axillary or clavicular LA.      Musculoskeletal: She exhibits no edema or tenderness.  Limited rom of LS today Nl gait   Lymphadenopathy:    She has no cervical adenopathy.  Neurological: She is alert. She has normal reflexes. No cranial nerve deficit. She exhibits normal muscle tone. Coordination normal.  Skin: Skin is warm and dry. No rash noted. No erythema. No pallor.  Psychiatric: She has a normal mood and affect.           Assessment & Plan:   Problem List Items Addressed This Visit      Cardiovascular and Mediastinum   Hypertension - Primary    bp is high Pt suspects this is whitecoat HTN  She will check bp at home (rev proper technique)  F/u approx 2 wk with her cuff and log  Will add or change tx if needed Given handout on DASH eating plan  Also enc exercise and wt loss       Relevant Medications   lisinopril (PRINIVIL,ZESTRIL) 10 MG tablet   atorvastatin (LIPITOR) 20 MG tablet   Other Relevant Orders   Comprehensive metabolic panel  Other   Estrogen deficiency    Ref for dexa for osteopenia      Relevant Orders   DG Bone Density   Hyperlipidemia    Lab today  Prev stable on atorvastatin       Relevant Medications   lisinopril (PRINIVIL,ZESTRIL) 10 MG tablet   atorvastatin (LIPITOR) 20 MG tablet   Other Relevant Orders   Lipid panel   Routine general medical examination at a health care facility    Reviewed health habits including diet and exercise and skin cancer prevention Reviewed appropriate screening tests for age  Also reviewed health mt list, fam hx and immunization status , as well as social and family history   See HPI AMW planned/will schedule  Labs ordered Don't forget to get your mammogram  Stop up front for referral for bone density test  Your blood pressure is high  Start checking your blood pressure at home- every other day- different times when relaxed Follow up with me in 2-3 weeks with blood pressure cuff and your log of readings and we will make a plan  Avoid processed foods  Flu shot and pneumonia shot today  Your have wax impaction in left ear- use your wax dissolving agent       Relevant Orders   TSH   CBC with Differential/Platelet    Other Visit Diagnoses    Need for 23-polyvalent pneumococcal polysaccharide vaccine       Relevant Medications   pneumococcal 23 valent vaccine (PNU-IMMUNE) injection 0.5 mL (Completed)   Need for  influenza vaccination       Relevant Orders   Flu Vaccine QUAD 36+ mos IM (Completed)

## 2016-05-11 NOTE — Progress Notes (Signed)
Pre visit review using our clinic review tool, if applicable. No additional management support is needed unless otherwise documented below in the visit note. 

## 2016-05-12 NOTE — Assessment & Plan Note (Signed)
Reviewed health habits including diet and exercise and skin cancer prevention Reviewed appropriate screening tests for age  Also reviewed health mt list, fam hx and immunization status , as well as social and family history   See HPI AMW planned/will schedule  Labs ordered Don't forget to get your mammogram  Stop up front for referral for bone density test  Your blood pressure is high  Start checking your blood pressure at home- every other day- different times when relaxed Follow up with me in 2-3 weeks with blood pressure cuff and your log of readings and we will make a plan  Avoid processed foods  Flu shot and pneumonia shot today  Your have wax impaction in left ear- use your wax dissolving agent

## 2016-05-12 NOTE — Assessment & Plan Note (Signed)
Ref for dexa for osteopenia

## 2016-05-12 NOTE — Assessment & Plan Note (Signed)
bp is high Pt suspects this is whitecoat HTN  She will check bp at home (rev proper technique)  F/u approx 2 wk with her cuff and log  Will add or change tx if needed Given handout on DASH eating plan  Also enc exercise and wt loss

## 2016-05-12 NOTE — Assessment & Plan Note (Signed)
Lab today  Prev stable on atorvastatin

## 2016-05-18 DIAGNOSIS — M5136 Other intervertebral disc degeneration, lumbar region: Secondary | ICD-10-CM | POA: Diagnosis not present

## 2016-05-19 ENCOUNTER — Encounter: Payer: Self-pay | Admitting: Family Medicine

## 2016-05-19 DIAGNOSIS — Z1231 Encounter for screening mammogram for malignant neoplasm of breast: Secondary | ICD-10-CM | POA: Diagnosis not present

## 2016-05-21 DIAGNOSIS — N6320 Unspecified lump in the left breast, unspecified quadrant: Secondary | ICD-10-CM | POA: Diagnosis not present

## 2016-05-21 DIAGNOSIS — R922 Inconclusive mammogram: Secondary | ICD-10-CM | POA: Diagnosis not present

## 2016-05-21 DIAGNOSIS — R928 Other abnormal and inconclusive findings on diagnostic imaging of breast: Secondary | ICD-10-CM | POA: Diagnosis not present

## 2016-05-21 LAB — HM MAMMOGRAPHY

## 2016-05-24 ENCOUNTER — Encounter: Payer: Self-pay | Admitting: Family Medicine

## 2016-05-31 ENCOUNTER — Ambulatory Visit: Payer: PPO | Admitting: Family Medicine

## 2016-06-02 DIAGNOSIS — M25559 Pain in unspecified hip: Secondary | ICD-10-CM | POA: Diagnosis not present

## 2016-06-02 DIAGNOSIS — M461 Sacroiliitis, not elsewhere classified: Secondary | ICD-10-CM | POA: Diagnosis not present

## 2016-06-02 DIAGNOSIS — M545 Low back pain: Secondary | ICD-10-CM | POA: Diagnosis not present

## 2016-06-02 DIAGNOSIS — M5136 Other intervertebral disc degeneration, lumbar region: Secondary | ICD-10-CM | POA: Diagnosis not present

## 2016-06-07 ENCOUNTER — Ambulatory Visit (INDEPENDENT_AMBULATORY_CARE_PROVIDER_SITE_OTHER): Payer: PPO | Admitting: Family Medicine

## 2016-06-07 ENCOUNTER — Encounter: Payer: Self-pay | Admitting: Family Medicine

## 2016-06-07 VITALS — BP 158/92 | HR 67 | Temp 98.0°F | Ht 61.75 in | Wt 155.0 lb

## 2016-06-07 DIAGNOSIS — I10 Essential (primary) hypertension: Secondary | ICD-10-CM | POA: Diagnosis not present

## 2016-06-07 MED ORDER — HYDROCHLOROTHIAZIDE 25 MG PO TABS: 25.0000 mg | ORAL_TABLET | Freq: Every day | ORAL | 3 refills | Status: DC

## 2016-06-07 NOTE — Patient Instructions (Addendum)
My favorite blood pressure cuff is OMRON brand for the arm size regular  Continue your lisinopril  Add hctz 25 mg each am  Update me if any side effects or problems  Take a look at the [facility] handout -for better eating for whole health   Follow up with me in 2-3 weeks - we will do labs at that visit (or after your trip is fine)

## 2016-06-07 NOTE — Progress Notes (Signed)
Pre visit review using our clinic review tool, if applicable. No additional management support is needed unless otherwise documented below in the visit note. 

## 2016-06-07 NOTE — Assessment & Plan Note (Signed)
bp readings are creeping up Disc lifestyle change- given DASH eating plan handout  rec exercise  Continue lisinopril 10 mg  Add hctz 25 mg daily-disc poss side eff such as freq urination and electrolyte change Planned f/u- will check labs that day

## 2016-06-07 NOTE — Progress Notes (Signed)
Subjective:    Patient ID: Anna Hendricks, female    DOB: 1940-10-19, 75 y.o.   MRN: [id number]  HPI Here for f/u for HTN   bp is stable today  No cp or palpitations or headaches or edema  No side effects to medicines  BP Readings from Last 3 Encounters:  06/07/16 (!) 158/92  05/11/16 (!) 156/82  05/27/15 (!) 142/82    She takes lisinopril 10 mg   Her bp cuff is very very old  Readings at home 159/89/ 137/69, 152/82 - too high for the most part   Wt Readings from Last 3 Encounters:  06/07/16 155 lb (70.3 kg)  05/11/16 152 lb 4 oz (69.1 kg)  05/27/15 152 lb (68.9 kg)  bmi is 28.5 Given a handout on the DASH eating plan last time   She is due for labs and has orders pending from annual exam last time   Does not eat much salt  She does not salt her food  Has not looked at the [facility] handout   Patient Active Problem List   Diagnosis Date Noted  . Estrogen deficiency 05/11/2016  . Routine general medical examination at a health care facility 05/11/2016  . Abdominal pain, left lower quadrant 05/27/2015  . Urinary frequency 05/27/2015  . Encounter for Medicare annual wellness exam 11/27/2013  . Hypertension 04/02/2011  . Hyperlipidemia 03/02/2011  . Colon cancer screening 03/02/2011  . Other screening mammogram 03/02/2011   Past Medical History:  Diagnosis Date  . Arthritis   . GERD (gastroesophageal reflux disease)   . History of chicken pox   . Hyperlipidemia   . Hypertension    Past Surgical History:  Procedure Laterality Date  . APPENDECTOMY  1955  . COLONOSCOPY    . DILATION AND CURETTAGE OF UTERUS    . ELBOW SURGERY  2007   Social History  Substance Use Topics  . Smoking status: Former Smoker    Quit date: 08/10/1987  . Smokeless tobacco: Never Used  . Alcohol use 2.4 oz/week    4 Glasses of wine per week     Comment: rare   Family History  Problem Relation Age of Onset  . Arthritis Mother   . Hyperlipidemia Mother   . Stroke Mother   .  Hypertension Father   . Cancer Maternal Aunt     breast CA   No Known Allergies Current Outpatient Prescriptions on File Prior to Visit  Medication Sig Dispense Refill  . aspirin 81 MG tablet Take 81 mg by mouth daily.    Marland Kitchen atorvastatin (LIPITOR) 20 MG tablet Take 1 tablet (20 mg total) by mouth daily. 90 tablet 3  . CALCIUM-VITAMIN D PO Take by mouth as directed.      . Cholecalciferol (VITAMIN D PO) Take 1 tablet by mouth daily.    Marland Kitchen lisinopril (PRINIVIL,ZESTRIL) 10 MG tablet Take 1 tablet (10 mg total) by mouth daily. 90 tablet 3  . Multiple Vitamin (MULTIVITAMIN) tablet Take 1 tablet by mouth daily.      . vitamin C (ASCORBIC ACID) 500 MG tablet Take 500 mg by mouth daily.       No current facility-administered medications on file prior to visit.      Review of Systems    Review of Systems  Constitutional: Negative for fever, appetite change, fatigue and unexpected weight change.  Eyes: Negative for pain and visual disturbance.  Respiratory: Negative for cough and shortness of breath.   Cardiovascular: Negative for cp  or palpitations    Gastrointestinal: Negative for nausea, diarrhea and constipation.  Genitourinary: Negative for urgency and frequency.  Skin: Negative for pallor or rash   Neurological: Negative for weakness, light-headedness, numbness and headaches.  Hematological: Negative for adenopathy. Does not bruise/bleed easily.  Psychiatric/Behavioral: Negative for dysphoric mood. The patient is not nervous/anxious.      Objective:   Physical Exam  Constitutional: She appears well-developed and well-nourished. No distress.  overwt and well appearing   HENT:  Head: Normocephalic and atraumatic.  Mouth/Throat: Oropharynx is clear and moist.  Eyes: Conjunctivae and EOM are normal. Pupils are equal, round, and reactive to light.  Neck: Normal range of motion. Neck supple. No JVD present. Carotid bruit is not present. No thyromegaly present.  Cardiovascular: Normal  rate, regular rhythm, normal heart sounds and intact distal pulses.  Exam reveals no gallop.   Pulmonary/Chest: Effort normal and breath sounds normal. No respiratory distress. She has no wheezes. She has no rales.  No crackles  Abdominal: Soft. Bowel sounds are normal. She exhibits no distension, no abdominal bruit and no mass. There is no tenderness.  Musculoskeletal: She exhibits no edema.  Lymphadenopathy:    She has no cervical adenopathy.  Neurological: She is alert. She has normal reflexes.  Skin: Skin is warm and dry. No rash noted.  Psychiatric: She has a normal mood and affect.          Assessment & Plan:   Problem List Items Addressed This Visit      Cardiovascular and Mediastinum   Hypertension - Primary    bp readings are creeping up Disc lifestyle change- given DASH eating plan handout  rec exercise  Continue lisinopril 10 mg  Add hctz 25 mg daily-disc poss side eff such as freq urination and electrolyte change Planned f/u- will check labs that day      Relevant Medications   hydrochlorothiazide (HYDRODIURIL) 25 MG tablet    Other Visit Diagnoses   None.

## 2016-06-14 ENCOUNTER — Ambulatory Visit (INDEPENDENT_AMBULATORY_CARE_PROVIDER_SITE_OTHER)
Admission: RE | Admit: 2016-06-14 | Discharge: 2016-06-14 | Disposition: A | Discharge status: Home / Self Care | Payer: PPO | Source: Ambulatory Visit | Attending: Family Medicine | Admitting: Family Medicine

## 2016-06-14 DIAGNOSIS — E2839 Other primary ovarian failure: Secondary | ICD-10-CM | POA: Diagnosis not present

## 2016-06-14 LAB — HM DEXA SCAN

## 2016-06-15 DIAGNOSIS — M5441 Lumbago with sciatica, right side: Secondary | ICD-10-CM | POA: Diagnosis not present

## 2016-06-17 ENCOUNTER — Encounter: Payer: Self-pay | Admitting: Family Medicine

## 2016-06-17 DIAGNOSIS — M8580 Other specified disorders of bone density and structure, unspecified site: Secondary | ICD-10-CM | POA: Insufficient documentation

## 2016-06-18 DIAGNOSIS — G8929 Other chronic pain: Secondary | ICD-10-CM | POA: Diagnosis not present

## 2016-06-18 DIAGNOSIS — M5441 Lumbago with sciatica, right side: Secondary | ICD-10-CM | POA: Diagnosis not present

## 2016-06-21 ENCOUNTER — Encounter: Payer: Self-pay | Admitting: *Deleted

## 2016-06-22 DIAGNOSIS — G8929 Other chronic pain: Secondary | ICD-10-CM | POA: Diagnosis not present

## 2016-06-22 DIAGNOSIS — M5441 Lumbago with sciatica, right side: Secondary | ICD-10-CM | POA: Diagnosis not present

## 2016-06-24 DIAGNOSIS — G8929 Other chronic pain: Secondary | ICD-10-CM | POA: Diagnosis not present

## 2016-06-24 DIAGNOSIS — M5441 Lumbago with sciatica, right side: Secondary | ICD-10-CM | POA: Diagnosis not present

## 2016-06-28 ENCOUNTER — Other Ambulatory Visit: Payer: Self-pay | Admitting: Family Medicine

## 2016-06-29 DIAGNOSIS — M25551 Pain in right hip: Secondary | ICD-10-CM | POA: Diagnosis not present

## 2016-06-29 DIAGNOSIS — M461 Sacroiliitis, not elsewhere classified: Secondary | ICD-10-CM | POA: Diagnosis not present

## 2016-07-05 DIAGNOSIS — M5441 Lumbago with sciatica, right side: Secondary | ICD-10-CM | POA: Diagnosis not present

## 2016-07-05 DIAGNOSIS — G8929 Other chronic pain: Secondary | ICD-10-CM | POA: Diagnosis not present

## 2016-07-14 ENCOUNTER — Ambulatory Visit: Payer: PPO | Admitting: Family Medicine

## 2016-07-14 DIAGNOSIS — Z0289 Encounter for other administrative examinations: Secondary | ICD-10-CM

## 2016-09-27 DIAGNOSIS — M461 Sacroiliitis, not elsewhere classified: Secondary | ICD-10-CM | POA: Diagnosis not present

## 2016-10-14 DIAGNOSIS — M461 Sacroiliitis, not elsewhere classified: Secondary | ICD-10-CM | POA: Diagnosis not present

## 2016-10-25 ENCOUNTER — Ambulatory Visit (HOSPITAL_COMMUNITY)
Admission: EM | Admit: 2016-10-25 | Discharge: 2016-10-25 | Disposition: A | Discharge status: Home / Self Care | Payer: PPO | Attending: Family Medicine | Admitting: Family Medicine

## 2016-10-25 ENCOUNTER — Encounter (HOSPITAL_COMMUNITY): Payer: Self-pay | Admitting: Emergency Medicine

## 2016-10-25 DIAGNOSIS — L03031 Cellulitis of right toe: Secondary | ICD-10-CM

## 2016-10-25 DIAGNOSIS — B351 Tinea unguium: Secondary | ICD-10-CM | POA: Diagnosis not present

## 2016-10-25 MED ORDER — CEPHALEXIN 500 MG PO CAPS
500.0000 mg | ORAL_CAPSULE | Freq: Four times a day (QID) | ORAL | 0 refills | Status: DC
Start: 2016-10-25 — End: 2017-06-02

## 2016-10-25 NOTE — ED Provider Notes (Signed)
CSN: [id number]     Arrival date & time 10/25/16  1000 History   First MD Initiated Contact with Patient 10/25/16 1013     Chief Complaint  Patient presents with  . Toe Pain   (Consider location/radiation/quality/duration/timing/severity/associated sxs/prior Treatment) 76 year old female presents to the urgent care with right great toe pain and redness. The redness started approximately 2 days ago. Denies any known injury.      Past Medical History:  Diagnosis Date  . Arthritis   . GERD (gastroesophageal reflux disease)   . History of chicken pox   . Hyperlipidemia   . Hypertension    Past Surgical History:  Procedure Laterality Date  . APPENDECTOMY  1955  . COLONOSCOPY    . DILATION AND CURETTAGE OF UTERUS    . ELBOW SURGERY  2007   Family History  Problem Relation Age of Onset  . Arthritis Mother   . Hyperlipidemia Mother   . Stroke Mother   . Hypertension Father   . Cancer Maternal Aunt     breast CA   Social History  Substance Use Topics  . Smoking status: Former Smoker    Quit date: 08/10/1987  . Smokeless tobacco: Never Used  . Alcohol use 2.4 oz/week    4 Glasses of wine per week     Comment: rare   OB History    No data available     Review of Systems  Musculoskeletal: Negative.   Skin: Positive for color change.  Neurological: Negative.   Psychiatric/Behavioral: Negative.   All other systems reviewed and are negative.   Allergies  Patient has no known allergies.  Home Medications   Prior to Admission medications   Medication Sig Start Date End Date Taking? Authorizing Provider  aspirin 81 MG tablet Take 81 mg by mouth daily.    Historical Provider, MD  atorvastatin (LIPITOR) 20 MG tablet TAKE ONE TABLET BY MOUTH ONCE DAILY 06/28/16   Abner Greenspan, MD  CALCIUM-VITAMIN D PO Take by mouth as directed.      Historical Provider, MD  cephALEXin (KEFLEX) 500 MG capsule Take 1 capsule (500 mg total) by mouth 4 (four) times daily. 10/25/16   Janne Napoleon, NP  Cholecalciferol (VITAMIN D PO) Take 1 tablet by mouth daily.    Historical Provider, MD  hydrochlorothiazide (HYDRODIURIL) 25 MG tablet Take 1 tablet (25 mg total) by mouth daily. In am 06/07/16   Abner Greenspan, MD  lisinopril (PRINIVIL,ZESTRIL) 10 MG tablet TAKE ONE TABLET BY MOUTH ONCE DAILY 06/28/16   Abner Greenspan, MD  Multiple Vitamin (MULTIVITAMIN) tablet Take 1 tablet by mouth daily.      Historical Provider, MD  vitamin C (ASCORBIC ACID) 500 MG tablet Take 500 mg by mouth daily.      Historical Provider, MD   Meds Ordered and Administered this Visit  Medications - No data to display  BP (!) 171/95 (BP Location: Right Arm)   Pulse 98   Temp 97.9 F (36.6 C) (Oral)   Resp 16   SpO2 97%  No data found.   Physical Exam  Constitutional: She is oriented to person, place, and time. She appears well-developed and well-nourished. No distress.  Eyes: EOM are normal.  Neck: Normal range of motion. Neck supple.  Cardiovascular: Normal rate.   Pulmonary/Chest: Effort normal. No respiratory distress.  Musculoskeletal: She exhibits no edema.  Neurological: She is alert and oriented to person, place, and time. She exhibits normal muscle tone.  Skin: Skin  is warm and dry.  Right great toe with onychomycosis involving the entire nail. It is thickened and mildly curved. There is erythema involving nearly the entire distal phalanx. Does not involve the joint. Positive for tenderness over the area of erythema. No bleeding, no exudate, purulence or evidence of paronychia.  Psychiatric: She has a normal mood and affect.  Nursing note and vitals reviewed.   Urgent Care Course     Procedures (including critical care time)  Labs Review Labs Reviewed - No data to display  Imaging Review No results found.   Visual Acuity Review  Right Eye Distance:   Left Eye Distance:   Bilateral Distance:    Right Eye Near:   Left Eye Near:    Bilateral Near:         MDM   1.  Onychomycosis of right great toe   2. Cellulitis of toe of right foot    Soak your toe and warm salt water 2-3 times a day. Take your antibiotic as directed. Today called the podiatrist listed on this first page. Make sure they know that you have an infection of your toe and need to be seen soon. Meds ordered this encounter  Medications  . cephALEXin (KEFLEX) 500 MG capsule    Sig: Take 1 capsule (500 mg total) by mouth 4 (four) times daily.    Dispense:  28 capsule    Refill:  0    Order Specific Question:   Supervising Provider    Answer:   Robyn Haber [[id number]]       Janne Napoleon, NP 10/25/16 1031

## 2016-10-25 NOTE — Discharge Instructions (Signed)
Soak your toe and warm salt water 2-3 times a day. Take your antibiotic as directed. Today called the podiatrist listed on this first page. Make sure they know that you have an infection of your toe and need to be seen soon.

## 2016-10-25 NOTE — ED Triage Notes (Signed)
The patient presented to the [facility] with a complaint of a possible infection to the great toe on her right foot x 2 days.

## 2016-11-03 ENCOUNTER — Encounter: Payer: Self-pay | Admitting: Podiatry

## 2016-11-03 ENCOUNTER — Ambulatory Visit (INDEPENDENT_AMBULATORY_CARE_PROVIDER_SITE_OTHER): Payer: PPO | Admitting: Podiatry

## 2016-11-03 DIAGNOSIS — L02611 Cutaneous abscess of right foot: Secondary | ICD-10-CM | POA: Diagnosis not present

## 2016-11-03 DIAGNOSIS — M6740 Ganglion, unspecified site: Secondary | ICD-10-CM | POA: Diagnosis not present

## 2016-11-03 DIAGNOSIS — L603 Nail dystrophy: Secondary | ICD-10-CM

## 2016-11-03 DIAGNOSIS — L03031 Cellulitis of right toe: Secondary | ICD-10-CM | POA: Diagnosis not present

## 2016-11-03 DIAGNOSIS — B351 Tinea unguium: Secondary | ICD-10-CM | POA: Diagnosis not present

## 2016-11-03 NOTE — Addendum Note (Signed)
Addended by: Cranford Mon R on: 11/03/2016 04:44 PM   Modules accepted: Orders

## 2016-11-03 NOTE — Patient Instructions (Signed)

## 2016-11-03 NOTE — Progress Notes (Signed)
Subjective:    Patient ID: Anna Hendricks, female    DOB: 1940/10/29, 76 y.o.   MRN: [id number]  HPI  76 year old female presents the office they for concerns arose big toenail becoming thick and discolored and painful and red and swollen around the edges. She states that she went to urgent care [facility] has completed 1 week of antibiotics have the toenail still painful and red around the edges. She denies any drainage or pus coming from the area. Areas painful pressure in shoes. Also bandages and she has secondary concerns of a "blister" to her left third toe. This area is not painful this is been ongoing for quite some time. She is under treatment for this. No injury. She has no other complaints today.  Review of Systems  All other systems reviewed and are negative.      Objective:   Physical Exam General: AAO x3, NAD  Dermatological: Nails are very hypertrophic, dystrophic, discolored 10 however the right hallux toenail appears to be significant hypertrophic and dystrophic and there is erythema on the proximal nail borders as well as the medial and lateral nail borders. There is no drainage or pus. There is tenderness the entire toenail. There is no open lesions identified at this time.  Vascular: Dorsalis Pedis artery and Posterior Tibial artery pedal pulses are 2/4 bilateral with immedate capillary fill time.  There is no pain with calf compression, swelling, warmth, erythema.   Neruologic: Grossly intact via light touch bilateral. Vibratory intact via tuning fork bilateral. Protective threshold with Semmes Wienstein monofilament intact to all pedal sites bilateral.  Musculoskeletal: Tenderness the entire right hallux toenail. Mobile soft tissue masses present on the dorsal aspect of the left third IPJ. There is no tenderness palpation is no edema, erythema. No other areas of tenderness identified bilaterally. Muscular strength 5/5 in all groups tested bilateral.  Gait: Unassisted,  Nonantalgic.      Assessment & Plan:  76 year old female right hallux onychodystrophy with localized infection; left third digit ganglion cyst -Treatment options discussed including all alternatives, risks, and complications -Etiology of symptoms were discussed  1. Right hallux onychodystrophy, localized infection -At this time, recommended total nail removal  to the right hallux due to infection. She wishes to hold off on permanent removal. Risks and complications were discussed with the patient for which they understand and  verbally consent to the procedure. Under sterile conditions a total of 3 mL of a mixture of 2% lidocaine plain and 0.5% Marcaine plain was infiltrated in a hallux block fashion. Once anesthetized, the skin was prepped in sterile fashion. A tourniquet was then applied. Next the right hallux nail was excised making sure to remove the entire offending nail border. Once the nail was  Removed, the area was debrided and the underlying skin was intact. The area was irrigated and hemostasis was obtained.  A dry sterile dressing was applied. After application of the dressing the tourniquet was removed and there is found to be an immediate capillary refill time to the digit. The patient tolerated the procedure well any complications. Post procedure instructions were discussed the patient for which he verbally understood. Follow-up in one week for nail check or sooner if any problems are to arise. Discussed signs/symptoms of worsening infection and directed to call the office immediately should any occur or go directly to the emergency room. In the meantime, encouraged to call the office with any questions, concerns, changes symptoms.  2. left third digit ganglion cyst -Lesion was  aspirated saying clear gel-like fluid was removed. And medical management was applied. Monitor for recurrence. There is recurrence discussed surgical excision.  Celesta Gentile, DPM

## 2016-11-08 DIAGNOSIS — Z961 Presence of intraocular lens: Secondary | ICD-10-CM | POA: Diagnosis not present

## 2016-11-08 DIAGNOSIS — H04123 Dry eye syndrome of bilateral lacrimal glands: Secondary | ICD-10-CM | POA: Diagnosis not present

## 2016-11-12 ENCOUNTER — Ambulatory Visit (INDEPENDENT_AMBULATORY_CARE_PROVIDER_SITE_OTHER): Payer: Self-pay | Admitting: Podiatry

## 2016-11-12 DIAGNOSIS — L603 Nail dystrophy: Secondary | ICD-10-CM

## 2016-11-12 DIAGNOSIS — Z9889 Other specified postprocedural states: Secondary | ICD-10-CM

## 2016-11-12 NOTE — Progress Notes (Signed)
Subjective: Anna Hendricks is a 76 y.o.  female returns to office today for follow up evaluation after having right Hallux total  nail avulsion performed. Patient has been soaking using epsom salts and applying topical antibiotic covered with bandaid daily. She denies any redness, drainage or any pain. Patient denies fevers, chills, nausea, vomiting. Denies any calf pain, chest pain, SOB.   Objective:  Vitals: Reviewed  General: Well developed, nourished, in no acute distress, alert and oriented x3   Dermatology: Skin is warm, dry and supple bilateral. Right hallux nail bed appears to be clean, dry, with mild granular tissue and surrounding scab. There is no surrounding erythema, edema, drainage/purulence. The remaining nails appear unremarkable at this time. There are no other lesions or other signs of infection present. The cyst has not reoccurred and there are no signs of infection.   Neurovascular status: Intact. No lower extremity swelling; No pain with calf compression bilateral.  Musculoskeletal: Decreased tenderness to palpation of the right hallux nail bed. Muscular strength within normal limits bilateral.   Assesement and Plan: S/p total nail avulsion, doing well.   -Continue soaking in epsom salts twice a day followed by antibiotic ointment and a band-aid. Can leave uncovered at night. Continue this until completely healed.  -If the area has not healed in 2 weeks, call the office for follow-up appointment, or sooner if any problems arise.  -Monitor for any signs/symptoms of infection. Call the office immediately if any occur or go directly to the emergency room. Call with any questions/concerns.  Celesta Gentile, DPM

## 2016-11-12 NOTE — Patient Instructions (Signed)

## 2016-11-16 ENCOUNTER — Telehealth: Payer: Self-pay | Admitting: *Deleted

## 2016-11-16 DIAGNOSIS — M5136 Other intervertebral disc degeneration, lumbar region: Secondary | ICD-10-CM | POA: Diagnosis not present

## 2016-11-16 DIAGNOSIS — M461 Sacroiliitis, not elsewhere classified: Secondary | ICD-10-CM | POA: Diagnosis not present

## 2016-11-16 NOTE — Telephone Encounter (Addendum)
-----   Message from Trula Slade, DPM sent at 11/16/2016  2:23 PM EDT ----- + fungus. At this time since the nail has been removed we can see how it comes in. If it starts to come in thick or discolored let us know and we can start some fungus treatment. Please let her know. Thanks. Informed pt of Dr. Leigh Aurora orders and she states it is doing just fine and will let us know if anything changes.

## 2016-11-29 DIAGNOSIS — M545 Low back pain: Secondary | ICD-10-CM | POA: Diagnosis not present

## 2016-11-29 DIAGNOSIS — M461 Sacroiliitis, not elsewhere classified: Secondary | ICD-10-CM | POA: Diagnosis not present

## 2016-12-01 DIAGNOSIS — M545 Low back pain: Secondary | ICD-10-CM | POA: Diagnosis not present

## 2016-12-01 DIAGNOSIS — M461 Sacroiliitis, not elsewhere classified: Secondary | ICD-10-CM | POA: Diagnosis not present

## 2016-12-06 DIAGNOSIS — M545 Low back pain: Secondary | ICD-10-CM | POA: Diagnosis not present

## 2016-12-06 DIAGNOSIS — M461 Sacroiliitis, not elsewhere classified: Secondary | ICD-10-CM | POA: Diagnosis not present

## 2016-12-09 DIAGNOSIS — M461 Sacroiliitis, not elsewhere classified: Secondary | ICD-10-CM | POA: Diagnosis not present

## 2016-12-09 DIAGNOSIS — M545 Low back pain: Secondary | ICD-10-CM | POA: Diagnosis not present

## 2016-12-14 DIAGNOSIS — M545 Low back pain: Secondary | ICD-10-CM | POA: Diagnosis not present

## 2016-12-14 DIAGNOSIS — M461 Sacroiliitis, not elsewhere classified: Secondary | ICD-10-CM | POA: Diagnosis not present

## 2016-12-16 DIAGNOSIS — M461 Sacroiliitis, not elsewhere classified: Secondary | ICD-10-CM | POA: Diagnosis not present

## 2016-12-16 DIAGNOSIS — M545 Low back pain: Secondary | ICD-10-CM | POA: Diagnosis not present

## 2016-12-20 DIAGNOSIS — M461 Sacroiliitis, not elsewhere classified: Secondary | ICD-10-CM | POA: Diagnosis not present

## 2016-12-20 DIAGNOSIS — M545 Low back pain: Secondary | ICD-10-CM | POA: Diagnosis not present

## 2016-12-23 DIAGNOSIS — M545 Low back pain: Secondary | ICD-10-CM | POA: Diagnosis not present

## 2016-12-23 DIAGNOSIS — M461 Sacroiliitis, not elsewhere classified: Secondary | ICD-10-CM | POA: Diagnosis not present

## 2016-12-27 DIAGNOSIS — M461 Sacroiliitis, not elsewhere classified: Secondary | ICD-10-CM | POA: Diagnosis not present

## 2016-12-27 DIAGNOSIS — M545 Low back pain: Secondary | ICD-10-CM | POA: Diagnosis not present

## 2016-12-30 DIAGNOSIS — M461 Sacroiliitis, not elsewhere classified: Secondary | ICD-10-CM | POA: Diagnosis not present

## 2016-12-30 DIAGNOSIS — M545 Low back pain: Secondary | ICD-10-CM | POA: Diagnosis not present

## 2017-01-05 DIAGNOSIS — M461 Sacroiliitis, not elsewhere classified: Secondary | ICD-10-CM | POA: Diagnosis not present

## 2017-01-05 DIAGNOSIS — M545 Low back pain: Secondary | ICD-10-CM | POA: Diagnosis not present

## 2017-01-13 DIAGNOSIS — M545 Low back pain: Secondary | ICD-10-CM | POA: Diagnosis not present

## 2017-01-13 DIAGNOSIS — M461 Sacroiliitis, not elsewhere classified: Secondary | ICD-10-CM | POA: Diagnosis not present

## 2017-01-20 DIAGNOSIS — M545 Low back pain: Secondary | ICD-10-CM | POA: Diagnosis not present

## 2017-01-20 DIAGNOSIS — M461 Sacroiliitis, not elsewhere classified: Secondary | ICD-10-CM | POA: Diagnosis not present

## 2017-01-27 DIAGNOSIS — M461 Sacroiliitis, not elsewhere classified: Secondary | ICD-10-CM | POA: Diagnosis not present

## 2017-01-27 DIAGNOSIS — M545 Low back pain: Secondary | ICD-10-CM | POA: Diagnosis not present

## 2017-02-03 DIAGNOSIS — M545 Low back pain: Secondary | ICD-10-CM | POA: Diagnosis not present

## 2017-02-03 DIAGNOSIS — M461 Sacroiliitis, not elsewhere classified: Secondary | ICD-10-CM | POA: Diagnosis not present

## 2017-02-17 DIAGNOSIS — M461 Sacroiliitis, not elsewhere classified: Secondary | ICD-10-CM | POA: Diagnosis not present

## 2017-02-17 DIAGNOSIS — M545 Low back pain: Secondary | ICD-10-CM | POA: Diagnosis not present

## 2017-02-22 DIAGNOSIS — M461 Sacroiliitis, not elsewhere classified: Secondary | ICD-10-CM | POA: Diagnosis not present

## 2017-02-22 DIAGNOSIS — M545 Low back pain: Secondary | ICD-10-CM | POA: Diagnosis not present

## 2017-04-11 ENCOUNTER — Other Ambulatory Visit: Payer: Self-pay | Admitting: Family Medicine

## 2017-04-12 ENCOUNTER — Other Ambulatory Visit: Payer: Self-pay | Admitting: Family Medicine

## 2017-06-01 ENCOUNTER — Telehealth: Payer: Self-pay | Admitting: Family Medicine

## 2017-06-01 ENCOUNTER — Ambulatory Visit: Payer: PPO

## 2017-06-01 DIAGNOSIS — E782 Mixed hyperlipidemia: Secondary | ICD-10-CM

## 2017-06-01 DIAGNOSIS — I10 Essential (primary) hypertension: Secondary | ICD-10-CM

## 2017-06-01 NOTE — Telephone Encounter (Signed)
-----   Message from Eustace Pen, LPN sent at [id number]  2:39 PM EDT ----- Regarding: Labs 10/25 Lab orders needed. Thank you.  Insurance:  [employer]

## 2017-06-02 ENCOUNTER — Ambulatory Visit (INDEPENDENT_AMBULATORY_CARE_PROVIDER_SITE_OTHER): Payer: PPO

## 2017-06-02 VITALS — BP 114/72 | HR 87 | Temp 98.0°F | Ht 61.5 in | Wt 152.0 lb

## 2017-06-02 DIAGNOSIS — I10 Essential (primary) hypertension: Secondary | ICD-10-CM | POA: Diagnosis not present

## 2017-06-02 DIAGNOSIS — Z0000 Encounter for general adult medical examination without abnormal findings: Secondary | ICD-10-CM

## 2017-06-02 DIAGNOSIS — E782 Mixed hyperlipidemia: Secondary | ICD-10-CM | POA: Diagnosis not present

## 2017-06-02 LAB — CBC WITH DIFFERENTIAL/PLATELET
BASOS ABS: 0 10*3/uL (ref 0.0–0.1)
Basophils Relative: 0.6 % (ref 0.0–3.0)
EOS ABS: 0.5 10*3/uL (ref 0.0–0.7)
Eosinophils Relative: 5.5 % — ABNORMAL HIGH (ref 0.0–5.0)
HEMATOCRIT: 40.9 % (ref 36.0–46.0)
HEMOGLOBIN: 13.7 g/dL (ref 12.0–15.0)
LYMPHS ABS: 1.7 10*3/uL (ref 0.7–4.0)
LYMPHS PCT: 20.9 % (ref 12.0–46.0)
MCHC: 33.5 g/dL (ref 30.0–36.0)
MCV: 99.7 fl (ref 78.0–100.0)
MONO ABS: 0.7 10*3/uL (ref 0.1–1.0)
Monocytes Relative: 8.4 % (ref 3.0–12.0)
NEUTROS ABS: 5.3 10*3/uL (ref 1.4–7.7)
Neutrophils Relative %: 64.6 % (ref 43.0–77.0)
Platelets: 421 10*3/uL — ABNORMAL HIGH (ref 150.0–400.0)
RBC: 4.1 Mil/uL (ref 3.87–5.11)
RDW: 14 % (ref 11.5–15.5)
WBC: 8.2 10*3/uL (ref 4.0–10.5)

## 2017-06-02 LAB — LIPID PANEL
CHOLESTEROL: 222 mg/dL — AB (ref 0–200)
HDL: 79.8 mg/dL (ref >39.00)
LDL CALC: 119 mg/dL — AB (ref 0–99)
NONHDL: 142.22
Total CHOL/HDL Ratio: 3
Triglycerides: 115 mg/dL (ref 0.0–149.0)
VLDL: 23 mg/dL (ref 0.0–40.0)

## 2017-06-02 LAB — COMPREHENSIVE METABOLIC PANEL
ALBUMIN: 4.5 g/dL (ref 3.5–5.2)
ALK PHOS: 70 U/L (ref 39–117)
ALT: 13 U/L (ref 0–35)
AST: 19 U/L (ref 0–37)
BILIRUBIN TOTAL: 0.7 mg/dL (ref 0.2–1.2)
BUN: 28 mg/dL — AB (ref 6–23)
CALCIUM: 10 mg/dL (ref 8.4–10.5)
CHLORIDE: 100 meq/L (ref 96–112)
CO2: 27 mEq/L (ref 19–32)
CREATININE: 1.38 mg/dL — AB (ref 0.40–1.20)
GFR: 39.4 mL/min — ABNORMAL LOW (ref >60.00)
Glucose, Bld: 101 mg/dL — ABNORMAL HIGH (ref 70–99)
Potassium: 4 mEq/L (ref 3.5–5.1)
SODIUM: 138 meq/L (ref 135–145)
TOTAL PROTEIN: 7.7 g/dL (ref 6.0–8.3)

## 2017-06-02 LAB — TSH: TSH: 2.42 u[IU]/mL (ref 0.35–4.50)

## 2017-06-02 NOTE — Patient Instructions (Signed)
Anna Hendricks , Thank you for taking time to come for your Medicare Wellness Visit. I appreciate your ongoing commitment to your health goals. Please review the following plan we discussed and let me know if I can assist you in the future.   These are the goals we discussed: Goals    . Increase physical activity          Starting 06/02/2017, I will continue to exercise for 100 minutes weekly.        This is a list of the screening recommended for you and due dates:  Health Maintenance  Topic Date Due  . Mammogram  05/20/2018*  . Tetanus Vaccine  08/09/2017  . Flu Shot  Completed  . DEXA scan (bone density measurement)  Completed  . Pneumonia vaccines  Completed  *Topic was postponed. The date shown is not the original due date.   Preventive Care for Adults  A healthy lifestyle and preventive care can promote health and wellness. Preventive health guidelines for adults include the following key practices.  . A routine yearly physical is a good way to check with your health care provider about your health and preventive screening. It is a chance to share any concerns and updates on your health and to receive a thorough exam.  . Visit your dentist for a routine exam and preventive care every 6 months. Brush your teeth twice a day and floss once a day. Good oral hygiene prevents tooth decay and gum disease.  . The frequency of eye exams is based on your age, health, family medical history, use  of contact lenses, and other factors. Follow your health care provider's recommendations for frequency of eye exams.  . Eat a healthy diet. Foods like vegetables, fruits, whole grains, low-fat dairy products, and lean protein foods contain the nutrients you need without too many calories. Decrease your intake of foods high in solid fats, added sugars, and salt. Eat the right amount of calories for you. Get information about a proper diet from your health care provider, if necessary.  . Regular  physical exercise is one of the most important things you can do for your health. Most adults should get at least 150 minutes of moderate-intensity exercise (any activity that increases your heart rate and causes you to sweat) each week. In addition, most adults need muscle-strengthening exercises on 2 or more days a week.  Silver Sneakers may be a benefit available to you. To determine eligibility, you may visit the website: www.silversneakers.com or contact program at 403-728-7613 Mon-Fri between 8AM-8PM.   . Maintain a healthy weight. The body mass index (BMI) is a screening tool to identify possible weight problems. It provides an estimate of body fat based on height and weight. Your health care provider can find your BMI and can help you achieve or maintain a healthy weight.   For adults 20 years and older: ? A BMI below 18.5 is considered underweight. ? A BMI of 18.5 to 24.9 is normal. ? A BMI of 25 to 29.9 is considered overweight. ? A BMI of 30 and above is considered obese.   . Maintain normal blood lipids and cholesterol levels by exercising and minimizing your intake of saturated fat. Eat a balanced diet with plenty of fruit and vegetables. Blood tests for lipids and cholesterol should begin at age 35 and be repeated every 5 years. If your lipid or cholesterol levels are high, you are over 50, or you are at high risk for heart  disease, you may need your cholesterol levels checked more frequently. Ongoing high lipid and cholesterol levels should be treated with medicines if diet and exercise are not working.  . If you smoke, find out from your health care provider how to quit. If you do not use tobacco, please do not start.  . If you choose to drink alcohol, please do not consume more than 2 drinks per day. One drink is considered to be 12 ounces (355 mL) of beer, 5 ounces (148 mL) of wine, or 1.5 ounces (44 mL) of liquor.  . If you are 32-40 years old, ask your health care provider if  you should take aspirin to prevent strokes.  . Use sunscreen. Apply sunscreen liberally and repeatedly throughout the day. You should seek shade when your shadow is shorter than you. Protect yourself by wearing long sleeves, pants, a wide-brimmed hat, and sunglasses year round, whenever you are outdoors.  . Once a month, do a whole body skin exam, using a mirror to look at the skin on your back. Tell your health care provider of new moles, moles that have irregular borders, moles that are larger than a pencil eraser, or moles that have changed in shape or color.

## 2017-06-02 NOTE — Progress Notes (Signed)
Subjective:   Anna Hendricks is a 76 y.o. female who presents for Medicare Annual (Subsequent) preventive examination.  Review of Systems:  N/A Cardiac Risk Factors include: advanced age (>26men, >30 women);hypertension     Objective:     Vitals: BP 114/72 (BP Location: Right Arm, Patient Position: Sitting, Cuff Size: Normal)   Pulse 87   Temp 98 F (36.7 C) (Oral)   Ht 5' 1.5" (1.562 m) Comment: no shoes  Wt 152 lb (68.9 kg)   SpO2 98%   BMI 28.26 kg/m   Body mass index is 28.26 kg/m.   Tobacco History  Smoking Status  . Former Smoker  . Quit date: 08/10/1987  Smokeless Tobacco  . Never Used     Counseling given: No   Past Medical History:  Diagnosis Date  . Arthritis   . GERD (gastroesophageal reflux disease)   . History of chicken pox   . Hyperlipidemia   . Hypertension    Past Surgical History:  Procedure Laterality Date  . APPENDECTOMY  1955  . COLONOSCOPY    . DILATION AND CURETTAGE OF UTERUS    . ELBOW SURGERY  2007   Family History  Problem Relation Age of Onset  . Arthritis Mother   . Hyperlipidemia Mother   . Stroke Mother   . Hypertension Father   . Cancer Maternal Aunt        breast CA   History  Sexual Activity  . Sexual activity: Not on file    Outpatient Encounter Prescriptions as of 06/02/2017  Medication Sig  . aspirin 81 MG tablet Take 81 mg by mouth daily.  Marland Kitchen atorvastatin (LIPITOR) 20 MG tablet TAKE 1 TABLET BY MOUTH EVERY DAY  . CALCIUM-VITAMIN D PO Take by mouth as directed.    . Cholecalciferol (VITAMIN D PO) Take 1 tablet by mouth daily.  . hydrochlorothiazide (HYDRODIURIL) 25 MG tablet Take 1 tablet (25 mg total) by mouth daily. In am  . lisinopril (PRINIVIL,ZESTRIL) 10 MG tablet TAKE 1 TABLET BY MOUTH EVERY DAY  . Multiple Vitamin (MULTIVITAMIN) tablet Take 1 tablet by mouth daily.    . vitamin C (ASCORBIC ACID) 500 MG tablet Take 500 mg by mouth daily.    . [DISCONTINUED] cephALEXin (KEFLEX) 500 MG capsule Take 1  capsule (500 mg total) by mouth 4 (four) times daily.   No facility-administered encounter medications on file as of 06/02/2017.     Activities of Daily Living In your present state of health, do you have any difficulty performing the following activities: 06/02/2017  Hearing? N  Vision? N  Difficulty concentrating or making decisions? N  Walking or climbing stairs? N  Dressing or bathing? N  Doing errands, shopping? N  Preparing Food and eating ? N  Using the Toilet? N  In the past six months, have you accidently leaked urine? N  Do you have problems with loss of bowel control? N  Managing your Medications? N  Managing your Finances? N  Housekeeping or managing your Housekeeping? N  Some recent data might be hidden    Patient Care Team: [facility], Wynelle Fanny, MD as PCP - General (Family Medicine)    Assessment:     Hearing Screening   125Hz  250Hz  500Hz  1000Hz  2000Hz  3000Hz  4000Hz  6000Hz  8000Hz   Right ear:   40 40 40  40    Left ear:   40 40 40  40    Vision Screening Comments: Last vision exam in 2018 with Dr. Katy Fitch  Exercise Activities and Dietary recommendations Current Exercise Habits: Structured exercise class, Time (Minutes): 50, Frequency (Times/Week): 2, Weekly Exercise (Minutes/Week): 100, Intensity: Moderate, Exercise limited by: orthopedic condition(s)  Goals    . Increase physical activity          Starting 06/02/2017, I will continue to exercise for 100 minutes weekly.       Fall Risk Fall Risk  06/02/2017 11/27/2013 10/05/2012  Falls in the past year? No Yes No  Number falls in past yr: - 1 -  Injury with Fall? - No -   Depression Screen PHQ 2/9 Scores 06/02/2017 11/27/2013 10/05/2012  PHQ - 2 Score 0 0 0  PHQ- 9 Score 0 - -     Cognitive Function MMSE - Mini Mental State Exam 06/02/2017  Orientation to time 5  Orientation to Place 5  Registration 3  Attention/ Calculation 0  Recall 2  Recall-comments unable to recall 1 of 3 words  Language- name 2  objects 0  Language- repeat 1  Language- follow 3 step command 3  Language- read & follow direction 0  Write a sentence 0  Copy design 0  Total score 19     PLEASE NOTE: A Mini-Cog screen was completed. Maximum score is 20. A value of 0 denotes this part of Folstein MMSE was not completed or the patient failed this part of the Mini-Cog screening.   Mini-Cog Screening Orientation to Time - Max 5 pts Orientation to Place - Max 5 pts Registration - Max 3 pts Recall - Max 3 pts Language Repeat - Max 1 pts Language Follow 3 Step Command - Max 3 pts     Immunization History  Administered Date(s) Administered  . Influenza Split 05/14/2011  . Influenza,inj,Quad PF,6+ Mos 05/11/2016  . Influenza-Unspecified 05/09/2013, 04/09/2017  . Pneumococcal Conjugate-13 11/27/2013  . Pneumococcal Polysaccharide-23 05/11/2016   Screening Tests Health Maintenance  Topic Date Due  . MAMMOGRAM  05/20/2018 (Originally 05/21/2017)  . TETANUS/TDAP  08/09/2017  . INFLUENZA VACCINE  Completed  . DEXA SCAN  Completed  . PNA vac Low Risk Adult  Completed      Plan:   I have personally reviewed, addressed, and noted the following in the patient's chart:  A. Medical and social history B. Use of alcohol, tobacco or illicit drugs  C. Current medications and supplements D. Functional ability and status E.  Nutritional status F.  Physical activity G. Advance directives H. List of other physicians I.  Hospitalizations, surgeries, and ER visits in previous 12 months J.  [place] to include hearing, vision, cognitive, depression L. Referrals and appointments - none  In addition, I have reviewed and discussed with patient certain preventive protocols, quality metrics, and best practice recommendations. A written personalized care plan for preventive services as well as general preventive health recommendations were provided to patient.  See attached scanned questionnaire for additional  information.   Signed,   Lindell Noe, MHA, BS, LPN Health Coach

## 2017-06-02 NOTE — Progress Notes (Signed)
Pre visit review using our clinic review tool, if applicable. No additional management support is needed unless otherwise documented below in the visit note. 

## 2017-06-02 NOTE — Progress Notes (Signed)
PCP notes:   Health maintenance:  Mammogram - addressed; pt will discuss with PCP frequency of screening (prefers 2 yr frequency)  Abnormal screenings:   Mini-Cog score: 19/20  Patient concerns:   None  Nurse concerns:  None  Next PCP appt:   06/06/17 @ 0930

## 2017-06-03 ENCOUNTER — Other Ambulatory Visit: Payer: Self-pay | Admitting: Family Medicine

## 2017-06-03 NOTE — Progress Notes (Signed)
I reviewed health advisor's note, was available for consultation, and agree with documentation and plan.  

## 2017-06-06 ENCOUNTER — Encounter: Payer: Self-pay | Admitting: Family Medicine

## 2017-06-06 ENCOUNTER — Ambulatory Visit (INDEPENDENT_AMBULATORY_CARE_PROVIDER_SITE_OTHER): Payer: PPO | Admitting: Family Medicine

## 2017-06-06 VITALS — BP 134/82 | HR 74 | Temp 97.9°F | Ht 61.5 in | Wt 156.8 lb

## 2017-06-06 DIAGNOSIS — M85859 Other specified disorders of bone density and structure, unspecified thigh: Secondary | ICD-10-CM | POA: Diagnosis not present

## 2017-06-06 DIAGNOSIS — E782 Mixed hyperlipidemia: Secondary | ICD-10-CM

## 2017-06-06 DIAGNOSIS — R8290 Unspecified abnormal findings in urine: Secondary | ICD-10-CM | POA: Diagnosis not present

## 2017-06-06 DIAGNOSIS — R7309 Other abnormal glucose: Secondary | ICD-10-CM | POA: Insufficient documentation

## 2017-06-06 DIAGNOSIS — I10 Essential (primary) hypertension: Secondary | ICD-10-CM | POA: Diagnosis not present

## 2017-06-06 DIAGNOSIS — Z0000 Encounter for general adult medical examination without abnormal findings: Secondary | ICD-10-CM

## 2017-06-06 DIAGNOSIS — R7989 Other specified abnormal findings of blood chemistry: Secondary | ICD-10-CM | POA: Insufficient documentation

## 2017-06-06 LAB — POC URINALSYSI DIPSTICK (AUTOMATED)
Bilirubin, UA: NEGATIVE
Blood, UA: 25
Glucose, UA: NEGATIVE
KETONES UA: NEGATIVE
Leukocytes, UA: NEGATIVE
Nitrite, UA: NEGATIVE
PH UA: 6 (ref 5.0–8.0)
PROTEIN UA: NEGATIVE
SPEC GRAV UA: 1.015 (ref 1.010–1.025)
Urobilinogen, UA: 0.2 E.U./dL

## 2017-06-06 MED ORDER — HYDROCHLOROTHIAZIDE 25 MG PO TABS: 25.0000 mg | ORAL_TABLET | Freq: Every morning | ORAL | 3 refills | Status: DC

## 2017-06-06 MED ORDER — LISINOPRIL 10 MG PO TABS: 10.0000 mg | ORAL_TABLET | Freq: Every day | ORAL | 3 refills | Status: DC

## 2017-06-06 MED ORDER — ATORVASTATIN CALCIUM 20 MG PO TABS: 20.0000 mg | ORAL_TABLET | Freq: Every day | ORAL | 3 refills | Status: DC

## 2017-06-06 NOTE — Patient Instructions (Addendum)
If you want to pursue colon cancer screening -we will sign you up for the cologuard program -let us know  I recommend a mammogram yearly at your age   Best thing you can do for memory is socialization   Your blood sugar is borderline  Try to get most of your carbohydrates from produce (with the exception of white potatoes)  Eat less bread/pasta/rice/snack foods/cereals/sweets and other items from the middle of the grocery store (processed carbs)  Your kidney numbers are up  Please make sure to drink more fluids - 64 oz daily (mostly water)  We will check a urinalysis and update you  Then will make a date to re check kidney labs

## 2017-06-06 NOTE — Assessment & Plan Note (Signed)
Reviewed health habits including diet and exercise and skin cancer prevention Reviewed appropriate screening tests for age  Also reviewed health mt list, fam hx and immunization status , as well as social and family history   See HPI Labs rev  amw rev-disc memory/urged to keep playing bridge Enc exercise  Watching glucose/renal fxn  Enc mammogram yearly (she pref 2 y)  Acupuncturist cologuard if interested (she is not now) Wt loss enc

## 2017-06-06 NOTE — Assessment & Plan Note (Signed)
Rev dexa 11/17 Good exercise  On ca and D No falls or fx  Re check 1 y

## 2017-06-06 NOTE — Progress Notes (Signed)
Subjective:    Patient ID: Anna Hendricks, female    DOB: January 13, 1941, 76 y.o.   MRN: [id number]  HPI Here for health maintenance exam and to review chronic medical problems    Feeling fine overall  Had a good birthday   Had amw on 10/25 Unable to recall 1 of 3 words on minicog No problems at all with memory at all -not concerned  Socializes and plays bridge    Wt Readings from Last 3 Encounters:  06/06/17 156 lb 12 oz (71.1 kg)  06/02/17 152 lb (68.9 kg)  06/07/16 155 lb (70.3 kg)  she ate a lot on her birthday - and sodium/ wt is up 4 lb  Exercise- yoga and water exercise class - enjoys it  Walking is tough with arthritis in back - may get back to it  29.14 kg/m   Mammogram 10/17  She prefers to screen every 2 y  Self breast exam -no lumps   dexa 11/17 osteopenia  Will get in a year  No falls / no fractures  Taking her ca and D regularly   Had her flu shot in sept   9/12 nl colonoscopy   bp is stable today   (ate [place] food yesterday-is a bit up from her usual)  No cp or palpitations or headaches or edema  No side effects to medicines  BP Readings from Last 3 Encounters:  06/06/17 134/82  06/02/17 114/72  10/25/16 (!) 171/95     Hyperlipidemia Lab Results  Component Value Date   CHOL 222 (H) 06/02/2017   CHOL 221 (H) 12/31/2014   CHOL 233 (H) 11/20/2013   Lab Results  Component Value Date   HDL 79.80 06/02/2017   HDL 75.50 12/31/2014   HDL 77.90 11/20/2013   Lab Results  Component Value Date   LDLCALC 119 (H) 06/02/2017   LDLCALC 119 (H) 12/31/2014   LDLCALC 117 (H) 11/20/2013   Lab Results  Component Value Date   TRIG 115.0 06/02/2017   TRIG 134.0 12/31/2014   TRIG 193.0 (H) 11/20/2013   Lab Results  Component Value Date   CHOLHDL 3 06/02/2017   CHOLHDL 3 12/31/2014   CHOLHDL 3 11/20/2013   Lab Results  Component Value Date   LDLDIRECT 148.7 01/04/2013   LDLDIRECT 151.7 09/26/2012   LDLDIRECT 110.7 05/03/2011   Atorvastatin  and diet  Stable LDL  Great HDL - is exercising   Other labs  Results for orders placed or performed in visit on 06/02/17  CBC with Differential/Platelet  Result Value Ref Range   WBC 8.2 4.0 - 10.5 K/uL   RBC 4.10 3.87 - 5.11 Mil/uL   Hemoglobin 13.7 12.0 - 15.0 g/dL   HCT 40.9 36.0 - 46.0 %   MCV 99.7 78.0 - 100.0 fl   MCHC 33.5 30.0 - 36.0 g/dL   RDW 14.0 11.5 - 15.5 %   Platelets 421.0 (H) 150.0 - 400.0 K/uL   Neutrophils Relative % 64.6 43.0 - 77.0 %   Lymphocytes Relative 20.9 12.0 - 46.0 %   Monocytes Relative 8.4 3.0 - 12.0 %   Eosinophils Relative 5.5 (H) 0.0 - 5.0 %   Basophils Relative 0.6 0.0 - 3.0 %   Neutro Abs 5.3 1.4 - 7.7 K/uL   Lymphs Abs 1.7 0.7 - 4.0 K/uL   Monocytes Absolute 0.7 0.1 - 1.0 K/uL   Eosinophils Absolute 0.5 0.0 - 0.7 K/uL   Basophils Absolute 0.0 0.0 - 0.1 K/uL  Comprehensive metabolic  panel  Result Value Ref Range   Sodium 138 135 - 145 mEq/L   Potassium 4.0 3.5 - 5.1 mEq/L   Chloride 100 96 - 112 mEq/L   CO2 27 19 - 32 mEq/L   Glucose, Bld 101 (H) 70 - 99 mg/dL   BUN 28 (H) 6 - 23 mg/dL   Creatinine, Ser 1.38 (H) 0.40 - 1.20 mg/dL   Total Bilirubin 0.7 0.2 - 1.2 mg/dL   Alkaline Phosphatase 70 39 - 117 U/L   AST 19 0 - 37 U/L   ALT 13 0 - 35 U/L   Total Protein 7.7 6.0 - 8.3 g/dL   Albumin 4.5 3.5 - 5.2 g/dL   Calcium 10.0 8.4 - 10.5 mg/dL   GFR 39.40 (L) >60.00 mL/min  Lipid panel  Result Value Ref Range   Cholesterol 222 (H) 0 - 200 mg/dL   Triglycerides 115.0 0.0 - 149.0 mg/dL   HDL 79.80 >39.00 mg/dL   VLDL 23.0 0.0 - 40.0 mg/dL   LDL Cholesterol 119 (H) 0 - 99 mg/dL   Total CHOL/HDL Ratio 3    NonHDL 142.22   TSH  Result Value Ref Range   TSH 2.42 0.35 - 4.50 uIU/mL     Cr is up at 1.38 with bun of 28 Is on hctz  No s/s of uti   ua today Results for orders placed or performed in visit on 06/06/17  POCT Urinalysis Dipstick (Automated)  Result Value Ref Range   Color, UA Yellow    Clarity, UA Clear    Glucose,  UA Negative    Bilirubin, UA Negative    Ketones, UA Negative    Spec Grav, UA 1.015 1.010 - 1.025   Blood, UA 25 Ery/uL    pH, UA 6.0 5.0 - 8.0   Protein, UA Negative    Urobilinogen, UA 0.2 0.2 or 1.0 E.U./dL   Nitrite, UA Negative    Leukocytes, UA Negative Negative     Platelets high 421 -no change in bleeding or clotting   Glucose 101 =but does not eat sweets as a rule   Patient Active Problem List   Diagnosis Date Noted  . Elevated serum creatinine 06/06/2017  . Elevated glucose level 06/06/2017  . Osteopenia 06/17/2016  . Estrogen deficiency 05/11/2016  . Routine general medical examination at a health care facility 05/11/2016  . Abdominal pain, left lower quadrant 05/27/2015  . Urinary frequency 05/27/2015  . Encounter for Medicare annual wellness exam 11/27/2013  . Hypertension 04/02/2011  . Hyperlipidemia 03/02/2011  . Colon cancer screening 03/02/2011  . Other screening mammogram 03/02/2011   Past Medical History:  Diagnosis Date  . Arthritis   . GERD (gastroesophageal reflux disease)   . History of chicken pox   . Hyperlipidemia   . Hypertension    Past Surgical History:  Procedure Laterality Date  . APPENDECTOMY  1955  . COLONOSCOPY    . DILATION AND CURETTAGE OF UTERUS    . ELBOW SURGERY  2007   Social History  Substance Use Topics  . Smoking status: Former Smoker    Quit date: 08/10/1987  . Smokeless tobacco: Never Used  . Alcohol use 2.4 oz/week    4 Glasses of wine per week     Comment: rare   Family History  Problem Relation Age of Onset  . Arthritis Mother   . Hyperlipidemia Mother   . Stroke Mother   . Hypertension Father   . Cancer Maternal Aunt  breast CA   No Known Allergies Current Outpatient Prescriptions on File Prior to Visit  Medication Sig Dispense Refill  . aspirin 81 MG tablet Take 81 mg by mouth daily.    Marland Kitchen CALCIUM-VITAMIN D PO Take by mouth as directed.      . Cholecalciferol (VITAMIN D PO) Take 1 tablet by  mouth daily.    . Multiple Vitamin (MULTIVITAMIN) tablet Take 1 tablet by mouth daily.      . vitamin C (ASCORBIC ACID) 500 MG tablet Take 500 mg by mouth daily.       No current facility-administered medications on file prior to visit.     Review of Systems  Constitutional: Negative for activity change, appetite change, fatigue, fever and unexpected weight change.  HENT: Negative for congestion, ear pain, rhinorrhea, sinus pressure and sore throat.   Eyes: Negative for pain, redness and visual disturbance.  Respiratory: Negative for cough, shortness of breath and wheezing.   Cardiovascular: Negative for chest pain and palpitations.  Gastrointestinal: Negative for abdominal pain, blood in stool, constipation and diarrhea.  Endocrine: Negative for polydipsia and polyuria.  Genitourinary: Negative for dysuria, frequency and urgency.  Musculoskeletal: Positive for arthralgias. Negative for back pain and myalgias.       Pos for knee pain from OA  Skin: Negative for pallor and rash.  Allergic/Immunologic: Negative for environmental allergies.  Neurological: Negative for dizziness, syncope and headaches.  Hematological: Negative for adenopathy. Does not bruise/bleed easily.  Psychiatric/Behavioral: Negative for decreased concentration and dysphoric mood. The patient is not nervous/anxious.        Objective:   Physical Exam  Constitutional: She appears well-developed and well-nourished. No distress.  obese and well appearing   HENT:  Head: Normocephalic and atraumatic.  Right Ear: External ear normal.  Left Ear: External ear normal.  Mouth/Throat: Oropharynx is clear and moist.  Eyes: Pupils are equal, round, and reactive to light. Conjunctivae and EOM are normal. No scleral icterus.  Neck: Normal range of motion. Neck supple. No JVD present. Carotid bruit is not present. No thyromegaly present.  Cardiovascular: Normal rate, regular rhythm, normal heart sounds and intact distal pulses.   Exam reveals no gallop.   Pulmonary/Chest: Effort normal and breath sounds normal. No respiratory distress. She has no wheezes. She exhibits no tenderness.  Abdominal: Soft. Bowel sounds are normal. She exhibits no distension, no abdominal bruit and no mass. There is no tenderness.  Genitourinary: No breast swelling, tenderness, discharge or bleeding.  Genitourinary Comments: Breast exam: No mass, nodules, thickening, tenderness, bulging, retraction, inflamation, nipple discharge or skin changes noted.  No axillary or clavicular LA.      Musculoskeletal: Normal range of motion. She exhibits no edema or tenderness.  Lymphadenopathy:    She has no cervical adenopathy.  Neurological: She is alert. She has normal reflexes. No cranial nerve deficit. She exhibits normal muscle tone. Coordination normal.  Skin: Skin is warm and dry. No rash noted. No erythema. No pallor.  Solar lentigines diffusely Few angiomas on trunk   Thickened L great toe nail  Psychiatric: She has a normal mood and affect.  Mentally sharp          Assessment & Plan:   Problem List Items Addressed This Visit      Cardiovascular and Mediastinum   Hypertension - Primary    bp in fair control at this time  BP Readings from Last 1 Encounters:  06/06/17 134/82   No changes needed Disc lifstyle change with low  sodium diet and exercise  Refilled lisinopril and hct  Watching renal fxn      Relevant Medications   lisinopril (PRINIVIL,ZESTRIL) 10 MG tablet   hydrochlorothiazide (HYDRODIURIL) 25 MG tablet   atorvastatin (LIPITOR) 20 MG tablet     Musculoskeletal and Integument   Osteopenia    Rev dexa 11/17 Good exercise  On ca and D No falls or fx  Re check 1 y         Other   Elevated glucose level    Glucose 101  disc imp of low glycemic diet and wt loss to prevent DM2  Will get A1C next time        Elevated serum creatinine    1.38-unusual for her  UA today  Enc fluid increase to 64 oz daily    On ace and hctz -may have to change  Avoid nsaids/nephrotoxins  Will make plan for re check      Hyperlipidemia    Disc goals for lipids and reasons to control them Rev labs with pt Rev low sat fat diet in detail Labs rev  On atorvastatin  LDL stable HDL is up from exercise       Relevant Medications   lisinopril (PRINIVIL,ZESTRIL) 10 MG tablet   hydrochlorothiazide (HYDRODIURIL) 25 MG tablet   atorvastatin (LIPITOR) 20 MG tablet   Routine general medical examination at a health care facility    Reviewed health habits including diet and exercise and skin cancer prevention Reviewed appropriate screening tests for age  Also reviewed health mt list, fam hx and immunization status , as well as social and family history   See HPI Labs rev  amw rev-disc memory/urged to keep playing bridge Enc exercise  Watching glucose/renal fxn  Enc mammogram yearly (she pref 2 y)  Acupuncturist cologuard if interested (she is not now) Wt loss enc

## 2017-06-06 NOTE — Assessment & Plan Note (Signed)
bp in fair control at this time  BP Readings from Last 1 Encounters:  06/06/17 134/82   No changes needed Disc lifstyle change with low sodium diet and exercise  Refilled lisinopril and hct  Watching renal fxn

## 2017-06-06 NOTE — Assessment & Plan Note (Signed)
Glucose 101  disc imp of low glycemic diet and wt loss to prevent DM2  Will get A1C next time

## 2017-06-06 NOTE — Assessment & Plan Note (Signed)
1.38-unusual for her  UA today  Enc fluid increase to 64 oz daily  On ace and hctz -may have to change  Avoid nsaids/nephrotoxins  Will make plan for re check

## 2017-06-06 NOTE — Assessment & Plan Note (Signed)
Disc goals for lipids and reasons to control them Rev labs with pt Rev low sat fat diet in detail Labs rev  On atorvastatin  LDL stable HDL is up from exercise

## 2017-06-07 LAB — URINE CULTURE
MICRO NUMBER:: 81209345
SPECIMEN QUALITY:: ADEQUATE

## 2017-06-28 ENCOUNTER — Telehealth: Payer: Self-pay | Admitting: Family Medicine

## 2017-06-28 DIAGNOSIS — R7989 Other specified abnormal findings of blood chemistry: Secondary | ICD-10-CM

## 2017-06-28 NOTE — Telephone Encounter (Signed)
-----   Message from Inocencio Homes, [place] sent at 06/27/2017  1:41 PM EST ----- Regarding: LABS  Patient is scheduled for labs on 07/07/17, can you please order labs. Thank you.

## 2017-07-07 ENCOUNTER — Other Ambulatory Visit (INDEPENDENT_AMBULATORY_CARE_PROVIDER_SITE_OTHER): Payer: PPO

## 2017-07-07 DIAGNOSIS — R7989 Other specified abnormal findings of blood chemistry: Secondary | ICD-10-CM

## 2017-07-07 LAB — RENAL FUNCTION PANEL
ALBUMIN: 4.1 g/dL (ref 3.5–5.2)
BUN: 22 mg/dL (ref 6–23)
CALCIUM: 9.8 mg/dL (ref 8.4–10.5)
CO2: 26 mEq/L (ref 19–32)
CREATININE: 1.48 mg/dL — AB (ref 0.40–1.20)
Chloride: 102 mEq/L (ref 96–112)
GFR: 36.34 mL/min — AB (ref >60.00)
GLUCOSE: 84 mg/dL (ref 70–99)
Phosphorus: 4.1 mg/dL (ref 2.3–4.6)
Potassium: 4.6 mEq/L (ref 3.5–5.1)
Sodium: 137 mEq/L (ref 135–145)

## 2017-07-08 ENCOUNTER — Telehealth: Payer: Self-pay | Admitting: *Deleted

## 2017-07-08 MED ORDER — AMLODIPINE BESYLATE 5 MG PO TABS: 5.0000 mg | ORAL_TABLET | Freq: Every day | ORAL | 3 refills | Status: DC

## 2017-07-08 NOTE — Telephone Encounter (Signed)
Pt notified of lab results and Dr. Marliss Coots comments. Pt will stop HCTZ (d/c on med list), and amlodipine sent to pharmacy. Pt will call back to schedule f/u appt due to it being so close to the holidays

## 2017-07-08 NOTE — Telephone Encounter (Signed)
-----   Message from Abner Greenspan, MD sent at 07/07/2017  3:13 PM EST ----- Kidney number is still elevated despite better fluid intake  I want her to hold her hctz (continue the lisinopril) Replace it with amlodipine 5 mg 1 po qd #30 3 refills -please send in  Let her know this is easier on the kidney  F/u in 2-3 weeks and we will check her bp and repeat labs  Try to keep up a good fluid intake  Also avoid nsaids as they can work the kidney too hard as well

## 2017-08-25 DIAGNOSIS — G894 Chronic pain syndrome: Secondary | ICD-10-CM | POA: Insufficient documentation

## 2017-08-25 DIAGNOSIS — M5136 Other intervertebral disc degeneration, lumbar region: Secondary | ICD-10-CM | POA: Insufficient documentation

## 2018-03-21 ENCOUNTER — Other Ambulatory Visit: Payer: Self-pay | Admitting: Family Medicine

## 2018-03-21 MED ORDER — AMLODIPINE BESYLATE 5 MG PO TABS: 5.0000 mg | ORAL_TABLET | Freq: Every day | ORAL | 3 refills | Status: DC

## 2018-03-21 NOTE — Telephone Encounter (Signed)
I spoke with pt; pt has not missed taking any amlodipine; last refill # 30 x 3 on 07/08/17. Advised pt will refill thru 05/2018 and pt said she will ck her schedule and cb for CPX appt. [place]

## 2018-03-21 NOTE — Telephone Encounter (Signed)
Amlodipine 5 mg refill Last Refill:07/08/17 # 30 Last OV: 06/06/17 PCP: [facility] [phone]

## 2018-03-21 NOTE — Telephone Encounter (Signed)
Copied from [place] [phone]. Topic: Quick Communication - Rx Refill/Question >> Mar 21, 2018 11:04 AM Selinda Flavin B, NT wrote: Medication: amLODipine (NORVASC) 5 MG tablet  Has the patient contacted their pharmacy? Yes.   (Agent: If no, request that the patient contact the pharmacy for the refill.) (Agent: If yes, when and what did the pharmacy advise?)  Preferred Pharmacy (with phone number or street name): [id number] - [place], [place]  Agent: Please be advised that RX refills may take up to 3 business days. We ask that you follow-up with your pharmacy.

## 2018-05-04 ENCOUNTER — Ambulatory Visit: Payer: PPO | Admitting: Internal Medicine

## 2018-05-04 ENCOUNTER — Encounter: Payer: Self-pay | Admitting: Internal Medicine

## 2018-05-04 ENCOUNTER — Ambulatory Visit: Payer: PPO | Admitting: Family Medicine

## 2018-05-04 ENCOUNTER — Ambulatory Visit (INDEPENDENT_AMBULATORY_CARE_PROVIDER_SITE_OTHER): Payer: PPO | Admitting: Internal Medicine

## 2018-05-04 VITALS — BP 92/64 | HR 92 | Temp 98.0°F | Ht 61.5 in | Wt 148.0 lb

## 2018-05-04 DIAGNOSIS — Z23 Encounter for immunization: Secondary | ICD-10-CM

## 2018-05-04 DIAGNOSIS — S91102A Unspecified open wound of left great toe without damage to nail, initial encounter: Secondary | ICD-10-CM | POA: Diagnosis not present

## 2018-05-04 MED ORDER — DOXYCYCLINE HYCLATE 100 MG PO TABS: 100.0000 mg | ORAL_TABLET | Freq: Two times a day (BID) | ORAL | 0 refills | Status: DC

## 2018-05-04 MED ORDER — MUPIROCIN 2 % EX OINT: 1.0000 "application " | TOPICAL_OINTMENT | Freq: Three times a day (TID) | CUTANEOUS | 0 refills | Status: DC

## 2018-05-04 NOTE — Progress Notes (Signed)
Chief Complaint  Patient presents with  . Toe Pain   Acute visit  1. Yesterday am silverware drawer fell on left foot with abrasion to left great toe. Tried hydrogen peroxide nothing else. Drawer was 5-10 lbs. She reports mild left great toe pain but able to move toe w/o problems    Review of Systems  Musculoskeletal: Positive for joint pain.  Skin:       +Abrasion     Past Medical History:  Diagnosis Date  . Arthritis   . GERD (gastroesophageal reflux disease)   . History of chicken pox   . Hyperlipidemia   . Hypertension    Past Surgical History:  Procedure Laterality Date  . APPENDECTOMY  1955  . COLONOSCOPY    . DILATION AND CURETTAGE OF UTERUS    . ELBOW SURGERY  2007   Family History  Problem Relation Age of Onset  . Arthritis Mother   . Hyperlipidemia Mother   . Stroke Mother   . Hypertension Father   . Cancer Maternal Aunt        breast CA   Social History   Socioeconomic History  . Marital status: Married    Spouse name: Not on file  . Number of children: Not on file  . Years of education: Not on file  . Highest education level: Not on file  Occupational History  . Not on file  Social Needs  . Financial resource strain: Not on file  . Food insecurity:    Worry: Not on file    Inability: Not on file  . Transportation needs:    Medical: Not on file    Non-medical: Not on file  Tobacco Use  . Smoking status: Former Smoker    Last attempt to quit: 08/10/1987    Years since quitting: 30.7  . Smokeless tobacco: Never Used  Substance and Sexual Activity  . Alcohol use: Yes    Alcohol/week: 4.0 standard drinks    Types: 4 Glasses of wine per week    Comment: rare  . Drug use: No  . Sexual activity: Not on file  Lifestyle  . Physical activity:    Days per week: Not on file    Minutes per session: Not on file  . Stress: Not on file  Relationships  . Social connections:    Talks on phone: Not on file    Gets together: Not on file    Attends  religious service: Not on file    Active member of club or organization: Not on file    Attends meetings of clubs or organizations: Not on file    Relationship status: Not on file  . Intimate partner violence:    Fear of current or ex partner: Not on file    Emotionally abused: Not on file    Physically abused: Not on file    Forced sexual activity: Not on file  Other Topics Concern  . Not on file  Social History Narrative  . Not on file   Current Meds  Medication Sig  . amLODipine (NORVASC) 5 MG tablet Take 1 tablet (5 mg total) by mouth daily.  Marland Kitchen aspirin 81 MG tablet Take 81 mg by mouth daily.  Marland Kitchen atorvastatin (LIPITOR) 20 MG tablet Take 1 tablet (20 mg total) by mouth daily.  Marland Kitchen CALCIUM-VITAMIN D PO Take by mouth as directed.    . Cholecalciferol (VITAMIN D PO) Take 1 tablet by mouth daily.  Marland Kitchen lisinopril (PRINIVIL,ZESTRIL) 10 MG tablet Take  1 tablet (10 mg total) by mouth daily.  . Multiple Vitamin (MULTIVITAMIN) tablet Take 1 tablet by mouth daily.    . vitamin C (ASCORBIC ACID) 500 MG tablet Take 500 mg by mouth daily.     Allergies  Allergen Reactions  . Other    No results found for this or any previous visit (from the past 2160 hour(s)). Objective  Body mass index is 27.51 kg/m. Wt Readings from Last 3 Encounters:  05/04/18 148 lb (67.1 kg)  06/06/17 156 lb 12 oz (71.1 kg)  06/02/17 152 lb (68.9 kg)   Temp Readings from Last 3 Encounters:  05/04/18 98 F (36.7 C) (Oral)  06/06/17 97.9 F (36.6 C) (Oral)  06/02/17 98 F (36.7 C) (Oral)   BP Readings from Last 3 Encounters:  05/04/18 92/64  06/06/17 134/82  06/02/17 114/72   Pulse Readings from Last 3 Encounters:  05/04/18 92  06/06/17 74  06/02/17 87    Physical Exam  Constitutional: She is oriented to person, place, and time. She appears well-developed and well-nourished. She is cooperative.  HENT:  Head: Normocephalic and atraumatic.  Mouth/Throat: Oropharynx is clear and moist and mucous membranes  are normal.  Eyes: Pupils are equal, round, and reactive to light. Conjunctivae are normal.  Cardiovascular: Normal rate, regular rhythm and normal heart sounds.  Pulmonary/Chest: Effort normal and breath sounds normal.  Neurological: She is alert and oriented to person, place, and time. Gait normal.  Skin: Skin is warm, dry and intact.  Psychiatric: She has a normal mood and affect. Her speech is normal and behavior is normal. Judgment and thought content normal. Cognition and memory are normal.  Nursing note and vitals reviewed.   Assessment   1. Left foot abrasion s/p trauma  Plan   1.  Wound care warm water and soap  bactroban  Prn Doxycycline if appearing cellulitic in future  tdap given today    Provider: Dr. Olivia Mackie McLean-Scocuzza-[facility]

## 2018-05-04 NOTE — Progress Notes (Signed)
Pre visit review using our clinic review tool, if applicable. No additional management support is needed unless otherwise documented below in the visit note. 

## 2018-05-04 NOTE — Patient Instructions (Addendum)
Use bactroban 2-3 x per day  Doxycycline 2x with food for 1 week    DTaP Vaccine (Diphtheria, Tetanus, and Pertussis): What You Need to Know 1. Why get vaccinated? Diphtheria, tetanus, and pertussis are serious diseases caused by bacteria. Diphtheria and pertussis are spread from person to person. Tetanus enters the body through cuts or wounds. DIPHTHERIA causes a thick covering in the back of the throat.  It can lead to breathing problems, paralysis, heart failure, and even death.  TETANUS (Lockjaw) causes painful tightening of the muscles, usually all over the body.  It can lead to "locking" of the jaw so the victim cannot open his mouth or swallow. Tetanus leads to death in up to 2 out of 10 cases.  PERTUSSIS (Whooping Cough) causes coughing spells so bad that it is hard for infants to eat, drink, or breathe. These spells can last for weeks.  It can lead to pneumonia, seizures (jerking and staring spells), brain damage, and death.  Diphtheria, tetanus, and pertussis vaccine (DTaP) can help prevent these diseases. Most children who are vaccinated with DTaP will be protected throughout childhood. Many more children would get these diseases if we stopped vaccinating. DTaP is a safer version of an older vaccine called DTP. DTP is no longer used in the [place]. 2. Who should get DTaP vaccine and when? Children should get 5 doses of DTaP vaccine, one dose at each of the following ages:  2 months  4 months  6 months  15-18 months  4-6 years  DTaP may be given at the same time as other vaccines. 3. Some children should not get DTaP vaccine or should wait  Children with minor illnesses, such as a cold, may be vaccinated. But children who are moderately or severely ill should usually wait until they recover before getting DTaP vaccine.  Any child who had a life-threatening allergic reaction after a dose of DTaP should not get another dose.  Any child who suffered a brain or  nervous system disease within 7 days after a dose of DTaP should not get another dose.  Talk with your doctor if your child: ? had a seizure or collapsed after a dose of DTaP, ? cried non-stop for 3 hours or more after a dose of DTaP, ? had a fever over 105F after a dose of DTaP. Ask your doctor for more information. Some of these children should not get another dose of pertussis vaccine, but may get a vaccine without pertussis, called DT. 4. Older children and adults DTaP is not licensed for adolescents, adults, or children 108 years of age and older. But older people still need protection. A vaccine called Tdap is similar to DTaP. A single dose of Tdap is recommended for people 11 through 78 years of age. Another vaccine, called Td, protects against tetanus and diphtheria, but not pertussis. It is recommended every 10 years. There are separate Vaccine Information Statements for these vaccines. 5. What are the risks from DTaP vaccine? Getting diphtheria, tetanus, or pertussis disease is much riskier than getting DTaP vaccine. However, a vaccine, like any medicine, is capable of causing serious problems, such as severe allergic reactions. The risk of DTaP vaccine causing serious harm, or death, is extremely small. Mild problems (common)  Fever (up to about 1 child in 4)  Redness or swelling where the shot was given (up to about 1 child in 4)  Soreness or tenderness where the shot was given (up to about 1 child in 4)  These problems occur more often after the 4th and 5th doses of the DTaP series than after earlier doses. Sometimes the 4th or 5th dose of DTaP vaccine is followed by swelling of the entire arm or leg in which the shot was given, lasting 1-7 days (up to about 1 child in 1). Other mild problems include:  Fussiness (up to about 1 child in 3)  Tiredness or poor appetite (up to about 1 child in 10)  Vomiting (up to about 1 child in 34) These problems generally occur 1-3 days after  the shot. Moderate problems (uncommon)  Seizure (jerking or staring) (about 1 child out of 14,000)  Non-stop crying, for 3 hours or more (up to about 1 child out of 1,000)  High fever, over 105F (about 1 child out of 16,000) Severe problems (very rare)  Serious allergic reaction (less than 1 out of a million doses)  Several other severe problems have been reported after DTaP vaccine. These include: ? Long-term seizures, coma, or lowered consciousness ? Permanent brain damage. These are so rare it is hard to tell if they are caused by the vaccine. Controlling fever is especially important for children who have had seizures, for any reason. It is also important if another family member has had seizures. You can reduce fever and pain by giving your child an aspirin-free pain reliever when the shot is given, and for the next 24 hours, following the package instructions. 6. What if there is a serious reaction? What should I look for? Look for anything that concerns you, such as signs of a severe allergic reaction, very high fever, or behavior changes. Signs of a severe allergic reaction can include hives, swelling of the face and throat, difficulty breathing, a fast heartbeat, dizziness, and weakness. These would start a few minutes to a few hours after the vaccination. What should I do?  If you think it is a severe allergic reaction or other emergency that can't wait, call 9-1-1 or get the person to the nearest hospital. Otherwise, call your doctor.  Afterward, the reaction should be reported to the Vaccine Adverse Event Reporting System (VAERS). Your doctor might file this report, or you can do it yourself through the VAERS web site at www.vaers.SamedayNews.es, or by calling (726) 087-9310. ? VAERS is only for reporting reactions. They do not give medical advice. 7. The National Vaccine Injury Compensation Program The [employer] Vaccine Injury Compensation Program (VICP) is a federal program that  was created to compensate people who may have been injured by certain vaccines. Persons who believe they may have been injured by a vaccine can learn about the program and about filing a claim by calling (281)823-2343 or visiting the [place] website at GoldCloset.com.ee. 8. How can I learn more?  Ask your doctor.  Call your local or state health department.  Contact the Centers for Disease Control and Prevention (CDC): ? Call 507-830-2349 (1-800-CDC-INFO) or ? Visit CDC's website at http://hunter.com/ CDC DTaP Vaccine (Diphtheria, Tetanus, and Pertussis) VIS (12/23/05) This information is not intended to replace advice given to you by your health care provider. Make sure you discuss any questions you have with your health care provider. Document Released: 05/23/2006 Document Revised: 04/15/2016 Document Reviewed: 04/15/2016 Elsevier Interactive Patient Education  2017 [place], Adult Taking care of your wound properly can help to prevent pain and infection. It can also help your wound to heal more quickly. How is this treated? Wound care  Follow instructions from your health  care provider about how to take care of your wound. Make sure you: ? Wash your hands with soap and water before you change the bandage (dressing). If soap and water are not available, use hand sanitizer. ? Change your dressing as told by your health care provider. ? Leave stitches (sutures), skin glue, or adhesive strips in place. These skin closures may need to stay in place for 2 weeks or longer. If adhesive strip edges start to loosen and curl up, you may trim the loose edges. Do not remove adhesive strips completely unless your health care provider tells you to do that.  Check your wound area every day for signs of infection. Check for: ? More redness, swelling, or pain. ? More fluid or blood. ? Warmth. ? Pus or a bad smell.  Ask your health care provider if you should clean  the wound with mild soap and water. Doing this may include: ? Using a clean towel to pat the wound dry after cleaning it. Do not rub or scrub the wound. ? Applying a cream or ointment. Do this only as told by your health care provider. ? Covering the incision with a clean dressing.  Ask your health care provider when you can leave the wound uncovered. Medicines   If you were prescribed an antibiotic medicine, cream, or ointment, take or use the antibiotic as told by your health care provider. Do not stop taking or using the antibiotic even if your condition improves.  Take over-the-counter and prescription medicines only as told by your health care provider. If you were prescribed pain medicine, take it at least 30 minutes before doing any wound care or as told by your health care provider. General instructions  Return to your normal activities as told by your health care provider. Ask your health care provider what activities are safe.  Do not scratch or pick at the wound.  Keep all follow-up visits as told by your health care provider. This is important.  Eat a diet that includes protein, vitamin A, vitamin C, and other nutrient-rich foods. These help the wound heal: ? Protein-rich foods include meat, dairy, beans, nuts, and other sources. ? Vitamin A-rich foods include carrots and dark green, leafy vegetables. ? Vitamin C-rich foods include citrus, tomatoes, and other fruits and vegetables. ? Nutrient-rich foods have protein, carbohydrates, fat, vitamins, or minerals. Eat a variety of healthy foods including vegetables, fruits, and whole grains. Contact a health care provider if:  You received a tetanus shot and you have swelling, severe pain, redness, or bleeding at the injection site.  Your pain is not controlled with medicine.  You have more redness, swelling, or pain around the wound.  You have more fluid or blood coming from the wound.  Your wound feels warm to the  touch.  You have pus or a bad smell coming from the wound.  You have a fever or chills.  You are nauseous or you vomit.  You are dizzy. Get help right away if:  You have a red streak going away from your wound.  The edges of the wound open up and separate.  Your wound is bleeding and the bleeding does not stop with gentle pressure.  You have a rash.  You faint.  You have trouble breathing. This information is not intended to replace advice given to you by your health care provider. Make sure you discuss any questions you have with your health care provider. Document Released: 05/04/2008 Document Revised: 03/24/2016 Document  Reviewed: 02/10/2016 Elsevier Interactive Patient Education  2017 [employer].

## 2018-06-12 ENCOUNTER — Other Ambulatory Visit: Payer: Self-pay | Admitting: *Deleted

## 2018-06-12 NOTE — Telephone Encounter (Signed)
Pt hasn't been seen by Dr. Glori Bickers in over a year and no future appts., please advise

## 2018-06-12 NOTE — Telephone Encounter (Signed)
Please schedule f/u of PE and refill until then

## 2018-06-13 MED ORDER — AMLODIPINE BESYLATE 5 MG PO TABS: 5.0000 mg | ORAL_TABLET | Freq: Every day | ORAL | 1 refills | Status: DC

## 2018-06-13 NOTE — Telephone Encounter (Signed)
Carrie scheduled CPE/AWV and med filled

## 2018-06-19 ENCOUNTER — Telehealth: Payer: Self-pay | Admitting: Family Medicine

## 2018-06-19 DIAGNOSIS — R7309 Other abnormal glucose: Secondary | ICD-10-CM

## 2018-06-19 DIAGNOSIS — R7989 Other specified abnormal findings of blood chemistry: Secondary | ICD-10-CM

## 2018-06-19 DIAGNOSIS — E782 Mixed hyperlipidemia: Secondary | ICD-10-CM

## 2018-06-19 DIAGNOSIS — I10 Essential (primary) hypertension: Secondary | ICD-10-CM

## 2018-06-19 NOTE — Telephone Encounter (Signed)
-----   Message from Eustace Pen, LPN sent at 09/81/1914  4:21 PM EST ----- Regarding: labs 11/13 Lab orders needed. Thank you.  Insurance:  [email or web address]

## 2018-06-21 ENCOUNTER — Ambulatory Visit: Payer: PPO

## 2018-06-23 ENCOUNTER — Ambulatory Visit: Payer: PPO

## 2018-06-26 ENCOUNTER — Ambulatory Visit: Payer: PPO

## 2018-06-27 ENCOUNTER — Ambulatory Visit: Payer: PPO

## 2018-06-30 ENCOUNTER — Encounter: Payer: PPO | Admitting: Family Medicine

## 2018-08-28 DIAGNOSIS — S62326A Displaced fracture of shaft of fifth metacarpal bone, right hand, initial encounter for closed fracture: Secondary | ICD-10-CM | POA: Diagnosis not present

## 2018-08-28 DIAGNOSIS — M79641 Pain in right hand: Secondary | ICD-10-CM | POA: Insufficient documentation

## 2018-09-04 ENCOUNTER — Telehealth: Payer: Self-pay

## 2018-09-04 NOTE — Telephone Encounter (Signed)
Copied from [place] [id number]. Topic: Appointment Scheduling - Transfer of Care >> Sep 04, 2018 10:54 AM Ahmed Prima L wrote: Pt is requesting to transfer FROM: Dr Glori Bickers @ [facility] Pt is requesting to transfer TO: Dr Sharlet Salina @ [facility] Reason for requested transfer: Tired of driving to [facility], patient's husband is a patient of Dr Sharlet Salina, Carman Ching DOB [birth year]  Send CRM to patient's current PCP (transferring FROM).

## 2018-09-04 NOTE — Telephone Encounter (Signed)
Aware, thanks!

## 2018-09-05 NOTE — Telephone Encounter (Signed)
Patient's husband, [name], calling to check the status of the transfer. Advised that we are still waiting on Dr Sharlet Salina to advise. Patient states that he waited all day yesterday for an answer. Advised that it could take a coupple days for the providers to advise on this. Husband is requesting a phone call from Dr Sharlet Salina.  CB#: [phone]

## 2018-09-05 NOTE — Telephone Encounter (Signed)
Called patient and left message informing that Dr Sharlet Salina is out of office and we will contact with an MD response when she is able to respond.

## 2018-09-06 NOTE — Telephone Encounter (Signed)
Okay to schedule no more than 1 new per day

## 2018-09-07 ENCOUNTER — Other Ambulatory Visit: Payer: Self-pay | Admitting: Family Medicine

## 2018-09-08 NOTE — Telephone Encounter (Signed)
error 

## 2018-09-08 NOTE — Telephone Encounter (Signed)
Called patient and scheduled.

## 2018-09-14 ENCOUNTER — Ambulatory Visit (INDEPENDENT_AMBULATORY_CARE_PROVIDER_SITE_OTHER): Payer: PPO | Admitting: Internal Medicine

## 2018-09-14 ENCOUNTER — Other Ambulatory Visit (INDEPENDENT_AMBULATORY_CARE_PROVIDER_SITE_OTHER): Payer: PPO

## 2018-09-14 ENCOUNTER — Encounter: Payer: Self-pay | Admitting: Internal Medicine

## 2018-09-14 VITALS — BP 122/62 | HR 70 | Temp 97.8°F | Ht 61.5 in | Wt 150.0 lb

## 2018-09-14 DIAGNOSIS — E782 Mixed hyperlipidemia: Secondary | ICD-10-CM | POA: Diagnosis not present

## 2018-09-14 DIAGNOSIS — Z0000 Encounter for general adult medical examination without abnormal findings: Secondary | ICD-10-CM | POA: Diagnosis not present

## 2018-09-14 DIAGNOSIS — I10 Essential (primary) hypertension: Secondary | ICD-10-CM

## 2018-09-14 DIAGNOSIS — R413 Other amnesia: Secondary | ICD-10-CM

## 2018-09-14 LAB — COMPREHENSIVE METABOLIC PANEL
ALT: 14 U/L (ref 0–35)
AST: 14 U/L (ref 0–37)
Albumin: 4.3 g/dL (ref 3.5–5.2)
Alkaline Phosphatase: 62 U/L (ref 39–117)
BUN: 15 mg/dL (ref 6–23)
CHLORIDE: 103 meq/L (ref 96–112)
CO2: 27 mEq/L (ref 19–32)
Calcium: 9.9 mg/dL (ref 8.4–10.5)
Creatinine, Ser: 1.03 mg/dL (ref 0.40–1.20)
GFR: 51.79 mL/min — ABNORMAL LOW (ref >60.00)
GLUCOSE: 88 mg/dL (ref 70–99)
POTASSIUM: 4 meq/L (ref 3.5–5.1)
SODIUM: 140 meq/L (ref 135–145)
Total Bilirubin: 0.7 mg/dL (ref 0.2–1.2)
Total Protein: 7.1 g/dL (ref 6.0–8.3)

## 2018-09-14 LAB — LIPID PANEL
Cholesterol: 188 mg/dL (ref 0–200)
HDL: 71.6 mg/dL (ref >39.00)
LDL CALC: 97 mg/dL (ref 0–99)
NONHDL: 116.09
Total CHOL/HDL Ratio: 3
Triglycerides: 94 mg/dL (ref 0.0–149.0)
VLDL: 18.8 mg/dL (ref 0.0–40.0)

## 2018-09-14 LAB — CBC
HEMATOCRIT: 39.2 % (ref 36.0–46.0)
HEMOGLOBIN: 13.4 g/dL (ref 12.0–15.0)
MCHC: 34.1 g/dL (ref 30.0–36.0)
MCV: 93.4 fl (ref 78.0–100.0)
Platelets: 352 10*3/uL (ref 150.0–400.0)
RBC: 4.19 Mil/uL (ref 3.87–5.11)
RDW: 14.5 % (ref 11.5–15.5)
WBC: 8.9 10*3/uL (ref 4.0–10.5)

## 2018-09-14 LAB — HEMOGLOBIN A1C: Hgb A1c MFr Bld: 5.7 % (ref 4.6–6.5)

## 2018-09-14 MED ORDER — LISINOPRIL 10 MG PO TABS: 10.0000 mg | ORAL_TABLET | Freq: Every day | ORAL | 3 refills | Status: DC

## 2018-09-14 MED ORDER — HYDROCHLOROTHIAZIDE 25 MG PO TABS: 25.0000 mg | ORAL_TABLET | Freq: Every day | ORAL | 3 refills | Status: DC

## 2018-09-14 MED ORDER — ATORVASTATIN CALCIUM 20 MG PO TABS: 20.0000 mg | ORAL_TABLET | Freq: Every day | ORAL | 3 refills | Status: DC

## 2018-09-14 MED ORDER — AMLODIPINE BESYLATE 5 MG PO TABS: 5.0000 mg | ORAL_TABLET | Freq: Every day | ORAL | 3 refills | Status: DC

## 2018-09-14 NOTE — Progress Notes (Signed)
   Subjective:   Patient ID: Anna Hendricks, female    DOB: Dec 05, 1940, 78 y.o.   MRN: [id number]  HPI Here for medicare wellness and physical, no new complaints. Please see A/P for status and treatment of chronic medical problems.   Diet: heart healthy Physical activity: sedentary Depression/mood screen: negative Hearing: intact to whispered voice Visual acuity: grossly normal, performs annual eye exam  ADLs: capable Fall risk: none Home safety: good Cognitive evaluation: intact to orientation, naming, recall and repetition EOL planning: adv directives discussed    Office Visit from 05/04/2018 in [facility]  PHQ-2 Total Score  0        Clinical Support from 06/02/2017 in [place] at [facility]  PHQ-9 Total Score  0      I have personally reviewed and have noted 1. The patient's medical and social history - reviewed today no changes 2. Their use of alcohol, tobacco or illicit drugs 3. Their current medications and supplements 4. The patient's functional ability including ADL's, fall risks, home safety risks and hearing or visual impairment. 5. Diet and physical activities 6. Evidence for depression or mood disorders 7. Care team reviewed and updated (available in snapshot)  Review of Systems  Constitutional: Negative.   HENT: Negative.   Eyes: Negative.   Respiratory: Negative for cough, chest tightness and shortness of breath.   Cardiovascular: Negative for chest pain, palpitations and leg swelling.  Gastrointestinal: Negative for abdominal distention, abdominal pain, constipation, diarrhea, nausea and vomiting.  Musculoskeletal: Negative.   Skin: Negative.   Neurological: Negative.   Psychiatric/Behavioral: Negative.     Objective:  Physical Exam Constitutional:      Appearance: She is well-developed.  HENT:     Head: Normocephalic and atraumatic.  Neck:     Musculoskeletal: Normal range of motion.  Cardiovascular:     Rate  and Rhythm: Normal rate and regular rhythm.  Pulmonary:     Effort: Pulmonary effort is normal. No respiratory distress.     Breath sounds: Normal breath sounds. No wheezing or rales.  Abdominal:     General: Bowel sounds are normal. There is no distension.     Palpations: Abdomen is soft.     Tenderness: There is no abdominal tenderness. There is no rebound.  Skin:    General: Skin is warm and dry.  Neurological:     Mental Status: She is alert and oriented to person, place, and time.     Coordination: Coordination normal.     Vitals:   09/14/18 0814  BP: 122/62  Pulse: 70  Temp: 97.8 F (36.6 C)  TempSrc: Oral  SpO2: 97%  Weight: 150 lb (68 kg)  Height: 5' 1.5" (1.562 m)    Assessment & Plan:

## 2018-09-14 NOTE — Assessment & Plan Note (Signed)
They are concerned and want to see neurologist for evaluation. Referral placed today.

## 2018-09-14 NOTE — Patient Instructions (Signed)

## 2018-09-14 NOTE — Assessment & Plan Note (Signed)
Flu shot up to date. Pneumonia up to date. Shingrix counseled. Tetanus up to date. Colonoscopy up to date. Mammogram counseled, pap smear aged out and dexa counseled. Counseled about sun safety and mole surveillance. Counseled about the dangers of distracted driving. Given 10 year screening recommendations.

## 2018-09-14 NOTE — Assessment & Plan Note (Signed)
Checking lipid panel and adjust lipitor 20 mg daily as needed. 

## 2018-09-14 NOTE — Assessment & Plan Note (Signed)
Taking hctz 25 mg daily, lisinopril 10 mg daily and amlodipine 5 mg daily. Checking CMP and adjust as needed. BP at goal.

## 2018-09-27 ENCOUNTER — Telehealth: Payer: Self-pay

## 2018-09-27 NOTE — Telephone Encounter (Signed)
Attempted to call the patient to offer a sooner appointment with Dr. Krista Blue on 09/28/2018. I left a voicemail asking to return my call.

## 2018-09-28 ENCOUNTER — Telehealth: Payer: Self-pay | Admitting: *Deleted

## 2018-09-28 ENCOUNTER — Ambulatory Visit: Payer: PPO | Admitting: Neurology

## 2018-09-28 DIAGNOSIS — S62326K Displaced fracture of shaft of fifth metacarpal bone, right hand, subsequent encounter for fracture with nonunion: Secondary | ICD-10-CM | POA: Diagnosis not present

## 2018-09-28 DIAGNOSIS — M79641 Pain in right hand: Secondary | ICD-10-CM | POA: Diagnosis not present

## 2018-09-28 DIAGNOSIS — S62329A Displaced fracture of shaft of unspecified metacarpal bone, initial encounter for closed fracture: Secondary | ICD-10-CM | POA: Insufficient documentation

## 2018-09-28 NOTE — Telephone Encounter (Signed)
I was able to speak with the patient.  She has been rescheduled to see Dr. Leta Baptist on 10/05/2018, in available new patient opening, at 11:30am.  The patient is aware to arrive at 11am for check-in.

## 2018-09-28 NOTE — Telephone Encounter (Signed)
I called the patient's home phone multiple times this morning and it rang busy each attempt.  I called her mobile number and left a message letting her know that we will need to reschedule her new patient appt.  Our office is closing due to inclement weather.  I provided our call back number and office hours.  When patient calls back, she can be scheduled with any of our providers who evaluate memory changes.

## 2018-10-05 ENCOUNTER — Telehealth: Payer: Self-pay | Admitting: Diagnostic Neuroimaging

## 2018-10-05 ENCOUNTER — Encounter: Payer: Self-pay | Admitting: Diagnostic Neuroimaging

## 2018-10-05 ENCOUNTER — Ambulatory Visit: Payer: PPO | Admitting: Diagnostic Neuroimaging

## 2018-10-05 VITALS — BP 104/64 | HR 73 | Ht 61.5 in | Wt 149.6 lb

## 2018-10-05 DIAGNOSIS — R413 Other amnesia: Secondary | ICD-10-CM

## 2018-10-05 NOTE — Patient Instructions (Addendum)
MEMORY LOSS / ANXIETY    - check MRI brain  - check labs (b12)  - safety / supervision issues reviewed  - caregiver resources provided  - caution with driving and finances  - reduce wine use (maximum 1 glass per day)

## 2018-10-05 NOTE — Progress Notes (Signed)
[facility]  PATIENT: Anna Hendricks DOB: 1941-01-05  REFERRING CLINICIAN: E Crawford HISTORY FROM: patient  REASON FOR VISIT: new consult    HISTORICAL  CHIEF COMPLAINT:  Chief Complaint  Patient presents with  . New Patient (Initial Visit)    Referred by Dr. Pricilla Holm   . Memory Loss    Rm 6, husband.      HISTORY OF PRESENT ILLNESS:   78 year old female here for evaluation of memory loss.  According to patient she does not have any major memory loss issues.  She does endorse significant alcohol use, drinking at least 1 L of wine per day.  According to husband he notes that patient has become more "anxious" in the early mornings for the last 6 to 12 months.  He has noted memory loss issues, change in mood, difficulty with conversations, recent events, shopping.  He has had to take over shopping because patient has been buying multiple items and duplication.  She is inappropriately spending money.  1 of her friends who plays bridge with her insinuated that there was some problem with patient but did not elaborate with husband.   REVIEW OF SYSTEMS: Full 14 system review of systems performed and negative with exception of: Memory loss joint pain.  ALLERGIES: No Known Allergies  HOME MEDICATIONS: Outpatient Medications Prior to Visit  Medication Sig Dispense Refill  . amLODipine (NORVASC) 5 MG tablet Take 1 tablet (5 mg total) by mouth daily. 90 tablet 3  . aspirin 81 MG tablet Take 81 mg by mouth daily.    Marland Kitchen atorvastatin (LIPITOR) 20 MG tablet Take 1 tablet (20 mg total) by mouth daily. 90 tablet 3  . CALCIUM-VITAMIN D PO Take by mouth as directed.      . Cholecalciferol (VITAMIN D PO) Take 1 tablet by mouth daily.    . hydrochlorothiazide (HYDRODIURIL) 25 MG tablet Take 1 tablet (25 mg total) by mouth daily. 90 tablet 3  . lisinopril (PRINIVIL,ZESTRIL) 10 MG tablet Take 1 tablet (10 mg total) by mouth daily. 90 tablet 3  . Multiple Vitamin  (MULTIVITAMIN) tablet Take 1 tablet by mouth daily.      . mupirocin ointment (BACTROBAN) 2 % Apply 1 application topically 3 (three) times daily. 30 g 0  . vitamin C (ASCORBIC ACID) 500 MG tablet Take 500 mg by mouth daily.       No facility-administered medications prior to visit.     PAST MEDICAL HISTORY: Past Medical History:  Diagnosis Date  . Arthritis   . GERD (gastroesophageal reflux disease)   . History of chicken pox   . Hyperlipidemia   . Hypertension     PAST SURGICAL HISTORY: Past Surgical History:  Procedure Laterality Date  . APPENDECTOMY  1955  . COLONOSCOPY    . DILATION AND CURETTAGE OF UTERUS    . ELBOW SURGERY  2007    FAMILY HISTORY: Family History  Problem Relation Age of Onset  . Arthritis Mother   . Hyperlipidemia Mother   . Stroke Mother   . Hypertension Father   . Cancer Maternal Aunt        breast CA    SOCIAL HISTORY: Social History   Socioeconomic History  . Marital status: Married    Spouse name: Not on file  . Number of children: Not on file  . Years of education: Not on file  . Highest education level: Not on file  Occupational History  . Not on file  Social Needs  .  Financial resource strain: Not on file  . Food insecurity:    Worry: Not on file    Inability: Not on file  . Transportation needs:    Medical: Not on file    Non-medical: Not on file  Tobacco Use  . Smoking status: Former Smoker    Last attempt to quit: 08/10/1987    Years since quitting: 31.1  . Smokeless tobacco: Never Used  Substance and Sexual Activity  . Alcohol use: Yes    Alcohol/week: 4.0 standard drinks    Types: 4 Glasses of wine per week    Comment: rare  . Drug use: No  . Sexual activity: Not on file  Lifestyle  . Physical activity:    Days per week: Not on file    Minutes per session: Not on file  . Stress: Not on file  Relationships  . Social connections:    Talks on phone: Not on file    Gets together: Not on file    Attends  religious service: Not on file    Active member of club or organization: Not on file    Attends meetings of clubs or organizations: Not on file    Relationship status: Not on file  . Intimate partner violence:    Fear of current or ex partner: Not on file    Emotionally abused: Not on file    Physically abused: Not on file    Forced sexual activity: Not on file  Other Topics Concern  . Not on file  Social History Narrative   Married.  Lives with husband [name].       PHYSICAL EXAM  GENERAL EXAM/CONSTITUTIONAL: Vitals:  Vitals:   10/05/18 1117  BP: 104/64  Pulse: 73  Weight: 149 lb 9.6 oz (67.9 kg)  Height: 5' 1.5" (1.562 m)     Body mass index is 27.81 kg/m. Wt Readings from Last 3 Encounters:  10/05/18 149 lb 9.6 oz (67.9 kg)  09/14/18 150 lb (68 kg)  05/04/18 148 lb (67.1 kg)     Patient is in no distress; well developed, nourished and groomed; neck is supple  CARDIOVASCULAR:  Examination of carotid arteries is normal; no carotid bruits  Regular rate and rhythm, no murmurs  Examination of peripheral vascular system by observation and palpation is normal  EYES:  Ophthalmoscopic exam of optic discs and posterior segments is normal; no papilledema or hemorrhages  Visual Acuity Screening   Right eye Left eye Both eyes  Without correction: 20/40 20/30   With correction:        MUSCULOSKELETAL:  Gait, strength, tone, movements noted in Neurologic exam below  NEUROLOGIC: MENTAL STATUS:  MMSE - [place] Exam 10/05/2018 06/02/2017  Orientation to time 5 5  Orientation to Place 4 5  Registration 3 3  Attention/ Calculation 4 0  Recall 1 2  Recall-comments - unable to recall 1 of 3 words  Language- name 2 objects 2 0  Language- repeat 1 1  Language- follow 3 step command 3 3  Language- read & follow direction 1 0  Write a sentence 1 0  Copy design 1 0  Total score 26 19    awake, alert, oriented to person, place and time  recent and  remote memory intact  normal attention and concentration  language fluent, comprehension intact, naming intact  fund of knowledge appropriate  CRANIAL NERVE:   2nd - no papilledema on fundoscopic exam  2nd, 3rd, 4th, 6th - pupils equal and  reactive to light, visual fields full to confrontation, extraocular muscles intact, no nystagmus  5th - facial sensation symmetric  7th - facial strength symmetric  8th - hearing intact  9th - palate elevates symmetrically, uvula midline  11th - shoulder shrug symmetric  12th - tongue protrusion midline  MOTOR:   normal bulk and tone, full strength in the BUE, BLE  SENSORY:   normal and symmetric to light touch  COORDINATION:   finger-nose-finger, fine finger movements normal  REFLEXES:   deep tendon reflexes TRACE and symmetric  GAIT/STATION:   narrow based gait     DIAGNOSTIC DATA (LABS, IMAGING, TESTING) - I reviewed patient records, labs, notes, testing and imaging myself where available.  Lab Results  Component Value Date   WBC 8.9 09/14/2018   HGB 13.4 09/14/2018   HCT 39.2 09/14/2018   MCV 93.4 09/14/2018   PLT 352.0 09/14/2018      Component Value Date/Time   NA 140 09/14/2018 0844   K 4.0 09/14/2018 0844   CL 103 09/14/2018 0844   CO2 27 09/14/2018 0844   GLUCOSE 88 09/14/2018 0844   BUN 15 09/14/2018 0844   CREATININE 1.03 09/14/2018 0844   CALCIUM 9.9 09/14/2018 0844   PROT 7.1 09/14/2018 0844   ALBUMIN 4.3 09/14/2018 0844   AST 14 09/14/2018 0844   ALT 14 09/14/2018 0844   ALKPHOS 62 09/14/2018 0844   BILITOT 0.7 09/14/2018 0844   Lab Results  Component Value Date   CHOL 188 09/14/2018   HDL 71.60 09/14/2018   LDLCALC 97 09/14/2018   LDLDIRECT 148.7 01/04/2013   TRIG 94.0 09/14/2018   CHOLHDL 3 09/14/2018   Lab Results  Component Value Date   HGBA1C 5.7 09/14/2018   No results found for: [id number] Lab Results  Component Value Date   TSH 2.42 06/02/2017      ASSESSMENT AND  PLAN  78 y.o. year old female here with:  Dx:  1. Memory loss     PLAN:  MEMORY LOSS / ANXIETY   - check MRI brain - check labs (B12) - safety / supervision issues reviewed - caregiver resources provided - caution with driving and finances - reduce alcohol use (currently 1L wine per day)  Orders Placed This Encounter  Procedures  . MR BRAIN WO CONTRAST  . Vitamin B12   Return for return to PCP, pending if symptoms worsen or fail to improve.    Penni Bombard, MD 08/17/3233, 57:32 AM Certified in Neurology, Neurophysiology and Neuroimaging  [facility] [address], [place], [place] [address] [phone]

## 2018-10-05 NOTE — Telephone Encounter (Signed)
Health team order sent to GI. No auth they will reach out to the pt to schedule.  °

## 2018-10-06 ENCOUNTER — Encounter: Payer: Self-pay | Admitting: Diagnostic Neuroimaging

## 2018-10-06 LAB — VITAMIN B12: VITAMIN B 12: 362 pg/mL (ref 232–1245)

## 2018-10-09 ENCOUNTER — Telehealth: Payer: Self-pay | Admitting: *Deleted

## 2018-10-09 NOTE — Telephone Encounter (Signed)
Spoke with patient and informed her that her Vit B 12 lab was normal. She verbalized understanding, appreciation.

## 2019-09-02 ENCOUNTER — Other Ambulatory Visit: Payer: Self-pay | Admitting: Internal Medicine

## 2019-09-25 ENCOUNTER — Other Ambulatory Visit: Payer: Self-pay

## 2019-09-25 MED ORDER — AMLODIPINE BESYLATE 5 MG PO TABS: 5.0000 mg | ORAL_TABLET | Freq: Every day | ORAL | 0 refills | Status: DC

## 2019-11-28 ENCOUNTER — Other Ambulatory Visit: Payer: Self-pay | Admitting: Internal Medicine

## 2019-12-19 ENCOUNTER — Other Ambulatory Visit: Payer: Self-pay | Admitting: Internal Medicine

## 2019-12-24 ENCOUNTER — Other Ambulatory Visit: Payer: Self-pay | Admitting: Internal Medicine

## 2019-12-27 ENCOUNTER — Other Ambulatory Visit: Payer: Self-pay

## 2019-12-27 ENCOUNTER — Encounter: Payer: Self-pay | Admitting: Internal Medicine

## 2019-12-27 ENCOUNTER — Ambulatory Visit (INDEPENDENT_AMBULATORY_CARE_PROVIDER_SITE_OTHER): Payer: PPO | Admitting: Internal Medicine

## 2019-12-27 VITALS — BP 118/60 | HR 68 | Temp 98.0°F | Ht 61.5 in | Wt 152.0 lb

## 2019-12-27 DIAGNOSIS — E782 Mixed hyperlipidemia: Secondary | ICD-10-CM

## 2019-12-27 DIAGNOSIS — Z789 Other specified health status: Secondary | ICD-10-CM | POA: Diagnosis not present

## 2019-12-27 DIAGNOSIS — R413 Other amnesia: Secondary | ICD-10-CM

## 2019-12-27 DIAGNOSIS — R7309 Other abnormal glucose: Secondary | ICD-10-CM | POA: Diagnosis not present

## 2019-12-27 DIAGNOSIS — I10 Essential (primary) hypertension: Secondary | ICD-10-CM

## 2019-12-27 DIAGNOSIS — Z0000 Encounter for general adult medical examination without abnormal findings: Secondary | ICD-10-CM

## 2019-12-27 LAB — COMPREHENSIVE METABOLIC PANEL
ALT: 14 U/L (ref 0–35)
AST: 19 U/L (ref 0–37)
Albumin: 4.3 g/dL (ref 3.5–5.2)
Alkaline Phosphatase: 66 U/L (ref 39–117)
BUN: 23 mg/dL (ref 6–23)
CO2: 28 mEq/L (ref 19–32)
Calcium: 9.3 mg/dL (ref 8.4–10.5)
Chloride: 102 mEq/L (ref 96–112)
Creatinine, Ser: 1.09 mg/dL (ref 0.40–1.20)
GFR: 48.35 mL/min — ABNORMAL LOW (ref >60.00)
Glucose, Bld: 87 mg/dL (ref 70–99)
Potassium: 3.5 mEq/L (ref 3.5–5.1)
Sodium: 139 mEq/L (ref 135–145)
Total Bilirubin: 0.6 mg/dL (ref 0.2–1.2)
Total Protein: 7.2 g/dL (ref 6.0–8.3)

## 2019-12-27 LAB — LIPID PANEL
Cholesterol: 186 mg/dL (ref 0–200)
HDL: 72 mg/dL (ref >39.00)
LDL Cholesterol: 82 mg/dL (ref 0–99)
NonHDL: 113.97
Total CHOL/HDL Ratio: 3
Triglycerides: 159 mg/dL — ABNORMAL HIGH (ref 0.0–149.0)
VLDL: 31.8 mg/dL (ref 0.0–40.0)

## 2019-12-27 LAB — CBC
HCT: 39.3 % (ref 36.0–46.0)
Hemoglobin: 13.3 g/dL (ref 12.0–15.0)
MCHC: 33.9 g/dL (ref 30.0–36.0)
MCV: 95 fl (ref 78.0–100.0)
Platelets: 395 10*3/uL (ref 150.0–400.0)
RBC: 4.14 Mil/uL (ref 3.87–5.11)
RDW: 14.5 % (ref 11.5–15.5)
WBC: 8.7 10*3/uL (ref 4.0–10.5)

## 2019-12-27 LAB — HEMOGLOBIN A1C: Hgb A1c MFr Bld: 5.5 % (ref 4.6–6.5)

## 2019-12-27 MED ORDER — LISINOPRIL 10 MG PO TABS: 10.0000 mg | ORAL_TABLET | Freq: Every day | ORAL | 3 refills | Status: DC

## 2019-12-27 MED ORDER — HYDROCHLOROTHIAZIDE 25 MG PO TABS: 25.0000 mg | ORAL_TABLET | Freq: Every day | ORAL | 3 refills | Status: DC

## 2019-12-27 MED ORDER — AMLODIPINE BESYLATE 5 MG PO TABS: 5.0000 mg | ORAL_TABLET | Freq: Every day | ORAL | 3 refills | Status: DC

## 2019-12-27 MED ORDER — ATORVASTATIN CALCIUM 20 MG PO TABS: 20.0000 mg | ORAL_TABLET | Freq: Every day | ORAL | 3 refills | Status: DC

## 2019-12-27 NOTE — Patient Instructions (Signed)
Health Maintenance, Female Adopting a healthy lifestyle and getting preventive care are important in promoting health and wellness. Ask your health care provider about:  The right schedule for you to have regular tests and exams.  Things you can do on your own to prevent diseases and keep yourself healthy. What should I know about diet, weight, and exercise? Eat a healthy diet   Eat a diet that includes plenty of vegetables, fruits, low-fat dairy products, and lean protein.  Do not eat a lot of foods that are high in solid fats, added sugars, or sodium. Maintain a healthy weight Body mass index (BMI) is used to identify weight problems. It estimates body fat based on height and weight. Your health care provider can help determine your BMI and help you achieve or maintain a healthy weight. Get regular exercise Get regular exercise. This is one of the most important things you can do for your health. Most adults should:  Exercise for at least 150 minutes each week. The exercise should increase your heart rate and make you sweat (moderate-intensity exercise).  Do strengthening exercises at least twice a week. This is in addition to the moderate-intensity exercise.  Spend less time sitting. Even light physical activity can be beneficial. Watch cholesterol and blood lipids Have your blood tested for lipids and cholesterol at 80 years of age, then have this test every 5 years. Have your cholesterol levels checked more often if:  Your lipid or cholesterol levels are high.  You are older than 80 years of age.  You are at high risk for heart disease. What should I know about cancer screening? Depending on your health history and family history, you may need to have cancer screening at various ages. This may include screening for:  Breast cancer.  Cervical cancer.  Colorectal cancer.  Skin cancer.  Lung cancer. What should I know about heart disease, diabetes, and high blood  pressure? Blood pressure and heart disease  High blood pressure causes heart disease and increases the risk of stroke. This is more likely to develop in people who have high blood pressure readings, are of African descent, or are overweight.  Have your blood pressure checked: ? Every 3-5 years if you are 18-39 years of age. ? Every year if you are 40 years old or older. Diabetes Have regular diabetes screenings. This checks your fasting blood sugar level. Have the screening done:  Once every three years after age 40 if you are at a normal weight and have a low risk for diabetes.  More often and at a younger age if you are overweight or have a high risk for diabetes. What should I know about preventing infection? Hepatitis B If you have a higher risk for hepatitis B, you should be screened for this virus. Talk with your health care provider to find out if you are at risk for hepatitis B infection. Hepatitis C Testing is recommended for:  Everyone born from 1945 through 1965.  Anyone with known risk factors for hepatitis C. Sexually transmitted infections (STIs)  Get screened for STIs, including gonorrhea and chlamydia, if: ? You are sexually active and are younger than 80 years of age. ? You are older than 80 years of age and your health care provider tells you that you are at risk for this type of infection. ? Your sexual activity has changed since you were last screened, and you are at increased risk for chlamydia or gonorrhea. Ask your health care provider if   you are at risk.  Ask your health care provider about whether you are at high risk for HIV. Your health care provider may recommend a prescription medicine to help prevent HIV infection. If you choose to take medicine to prevent HIV, you should first get tested for HIV. You should then be tested every 3 months for as long as you are taking the medicine. Pregnancy  If you are about to stop having your period (premenopausal) and  you may become pregnant, seek counseling before you get pregnant.  Take 400 to 800 micrograms (mcg) of folic acid every day if you become pregnant.  Ask for birth control (contraception) if you want to prevent pregnancy. Osteoporosis and menopause Osteoporosis is a disease in which the bones lose minerals and strength with aging. This can result in bone fractures. If you are 65 years old or older, or if you are at risk for osteoporosis and fractures, ask your health care provider if you should:  Be screened for bone loss.  Take a calcium or vitamin D supplement to lower your risk of fractures.  Be given hormone replacement therapy (HRT) to treat symptoms of menopause. Follow these instructions at home: Lifestyle  Do not use any products that contain nicotine or tobacco, such as cigarettes, e-cigarettes, and chewing tobacco. If you need help quitting, ask your health care provider.  Do not use street drugs.  Do not share needles.  Ask your health care provider for help if you need support or information about quitting drugs. Alcohol use  Do not drink alcohol if: ? Your health care provider tells you not to drink. ? You are pregnant, may be pregnant, or are planning to become pregnant.  If you drink alcohol: ? Limit how much you use to 0-1 drink a day. ? Limit intake if you are breastfeeding.  Be aware of how much alcohol is in your drink. In the [place] one drink equals one 12 oz bottle of beer (355 mL), one 5 oz glass of wine (148 mL), or one 1 oz glass of hard liquor (44 mL). General instructions  Schedule regular health, dental, and eye exams.  Stay current with your vaccines.  Tell your health care provider if: ? You often feel depressed. ? You have ever been abused or do not feel safe at home. Summary  Adopting a healthy lifestyle and getting preventive care are important in promoting health and wellness.  Follow your health care provider's instructions about healthy  diet, exercising, and getting tested or screened for diseases.  Follow your health care provider's instructions on monitoring your cholesterol and blood pressure. This information is not intended to replace advice given to you by your health care provider. Make sure you discuss any questions you have with your health care provider. Document Revised: 07/19/2018 Document Reviewed: 07/19/2018 Elsevier Patient Education  2020 Elsevier Inc.  

## 2019-12-27 NOTE — Progress Notes (Signed)
Subjective:   Patient ID: Anna Hendricks, female    DOB: [birth year], 79 y.o.   MRN: [id number]  HPI Here for medicare wellness and physical, no new complaints. Please see A/P for status and treatment of chronic medical problems.   Diet: heart healthy Physical activity: sedentary Depression/mood screen: negative Hearing: intact to whispered voice Visual acuity: grossly normal, performs annual eye exam  ADLs: capable, supervision needed for some IADLS due to memory concerns Fall risk: none Home safety: good Cognitive evaluation: intact to orientation, naming, recall and repetition EOL planning: adv directives discussed    Office Visit from 12/27/2019 in [place] at [facility] Total Score  0        Clinical Support from 06/02/2017 in [place] at [facility]  PHQ-9 Total Score  0     I have personally reviewed and have noted 1. The patient's medical and social history - reviewed today no changes 2. Their use of alcohol, tobacco or illicit drugs 3. Their current medications and supplements 4. The patient's functional ability including ADL's, fall risks, home safety risks and hearing or visual impairment. 5. Diet and physical activities 6. Evidence for depression or mood disorders 7. Care team reviewed and updated  Patient Care Team: Hoyt Koch, MD as PCP - General (Internal Medicine) Past Medical History:  Diagnosis Date  . Arthritis   . GERD (gastroesophageal reflux disease)   . History of chicken pox   . Hyperlipidemia   . Hypertension    Past Surgical History:  Procedure Laterality Date  . APPENDECTOMY  1955  . COLONOSCOPY    . DILATION AND CURETTAGE OF UTERUS    . ELBOW SURGERY  2007   Family History  Problem Relation Age of Onset  . Arthritis Mother   . Hyperlipidemia Mother   . Stroke Mother   . Hypertension Father   . Cancer Maternal Aunt        breast CA   Review of Systems  Constitutional: Negative.   HENT:  Negative.   Eyes: Negative.   Respiratory: Negative for cough, chest tightness and shortness of breath.   Cardiovascular: Negative for chest pain, palpitations and leg swelling.  Gastrointestinal: Negative for abdominal distention, abdominal pain, constipation, diarrhea, nausea and vomiting.  Musculoskeletal: Negative.   Skin: Negative.   Neurological: Negative.        Memory changes  Psychiatric/Behavioral: Negative.     Objective:  Physical Exam Constitutional:      Appearance: She is well-developed.  HENT:     Head: Normocephalic and atraumatic.  Cardiovascular:     Rate and Rhythm: Normal rate and regular rhythm.  Pulmonary:     Effort: Pulmonary effort is normal. No respiratory distress.     Breath sounds: Normal breath sounds. No wheezing or rales.  Abdominal:     General: Bowel sounds are normal. There is no distension.     Palpations: Abdomen is soft.     Tenderness: There is no abdominal tenderness. There is no rebound.  Musculoskeletal:     Cervical back: Normal range of motion.  Skin:    General: Skin is warm and dry.  Neurological:     Mental Status: She is alert and oriented to person, place, and time.     Coordination: Coordination normal.     Vitals:   12/27/19 1354  BP: 118/60  Pulse: 68  Temp: 98 F (36.7 C)  SpO2: 98%  Weight: 152 lb (68.9 kg)  Height:  5' 1.5" (1.562 m)   This visit occurred during the SARS-CoV-2 public health emergency.  Safety protocols were in place, including screening questions prior to the visit, additional usage of staff PPE, and extensive cleaning of exam room while observing appropriate contact time as indicated for disinfecting solutions.   Assessment & Plan:

## 2019-12-28 DIAGNOSIS — Z789 Other specified health status: Secondary | ICD-10-CM | POA: Insufficient documentation

## 2019-12-28 NOTE — Assessment & Plan Note (Signed)
BP at goal on amlodipine, lisinopril, hctz. Checking CMP and adjust as needed.

## 2019-12-28 NOTE — Assessment & Plan Note (Signed)
Counseled on safe recommended limits and she is above those at 1L wine per day. She does not feel need to change at this time. Potentially causing her memory changes and we discussed with patient and her husband today.

## 2019-12-28 NOTE — Assessment & Plan Note (Signed)
Per patient and husband this is stable from prior. Advised that her increased alcohol intake can cause significant worsening to memory and she is not sure she wants to attempt cut back at this time.

## 2019-12-28 NOTE — Assessment & Plan Note (Signed)
Flu shot due next season. Covid-19 complete will call with dates. Pneumonia up to date. Shingrix counseled. Tetanus due 2029. Colonoscopy aged out prior to recall. Mammogram due 2022, pap smear aged out and dexa declines further. Counseled about sun safety and mole surveillance. Counseled about the dangers of distracted driving. Given 10 year screening recommendations.

## 2019-12-28 NOTE — Assessment & Plan Note (Signed)
Checking HgA1c, likely alcohol intake excessive is contributing.

## 2019-12-28 NOTE — Assessment & Plan Note (Signed)
Checking lipid panel and adjust lipitor 20 mg daily.  

## 2020-01-17 ENCOUNTER — Telehealth: Payer: Self-pay | Admitting: Internal Medicine

## 2020-01-17 NOTE — Telephone Encounter (Signed)
New message:   Pt's spouse called due to the pt having survery jaw pain on the left side of her face. Pt has been triaged due to the pt's spouse thinking she may be having a stroke.

## 2020-07-21 ENCOUNTER — Telehealth: Payer: Self-pay | Admitting: Internal Medicine

## 2020-07-21 NOTE — Progress Notes (Signed)
  Chronic Care Management   Outreach Note  07/21/2020 Name: Anna Hendricks MRN: [id number] DOB: [birth year]  Referred by: Hoyt Koch, MD Reason for referral : No chief complaint on file.   An unsuccessful telephone outreach was attempted today. The patient was referred to the pharmacist for assistance with care management and care coordination.   Follow Up Plan:   Carley Perdue [employer] Scheduler

## 2020-07-22 ENCOUNTER — Telehealth: Payer: Self-pay | Admitting: Internal Medicine

## 2020-07-22 NOTE — Progress Notes (Signed)
  Chronic Care Management   Note  07/22/2020 Name: Anna Hendricks MRN: [id number] DOB: August 25, 1940  Anna Hendricks is a 79 y.o. year old female who is a primary care patient of Hoyt Koch, MD. I reached out to Roddie Mc by phone today in response to a referral sent by Ms. [place] PCP, Hoyt Koch, MD.   [name] was given information about Chronic Care Management services today including:  1. CCM service includes personalized support from designated clinical staff supervised by her physician, including individualized plan of care and coordination with other care providers 2. 24/7 contact phone numbers for assistance for urgent and routine care needs. 3. Service will only be billed when office clinical staff spend 20 minutes or more in a month to coordinate care. 4. Only one practitioner may furnish and bill the service in a calendar month. 5. The patient may stop CCM services at any time (effective at the end of the month) by phone call to the office staff.   Patient agreed to services and verbal consent obtained.   Follow up plan:   Carley Perdue [employer] Scheduler

## 2020-08-27 ENCOUNTER — Telehealth: Payer: Self-pay | Admitting: Pharmacist

## 2020-08-27 NOTE — Progress Notes (Signed)
° ° °  Chronic Care Management Pharmacy Assistant   Name: Anna Hendricks  MRN: [id number] DOB: [birth year]  Reason for Encounter: Initial Questions   PCP : Hoyt Koch, MD  Allergies:  No Known Allergies  Medications: Outpatient Encounter Medications as of 08/27/2020  Medication Sig   amLODipine (NORVASC) 5 MG tablet Take 1 tablet (5 mg total) by mouth daily.   aspirin 81 MG tablet Take 81 mg by mouth daily.   atorvastatin (LIPITOR) 20 MG tablet Take 1 tablet (20 mg total) by mouth daily.   CALCIUM-VITAMIN D PO Take by mouth as directed.     Cholecalciferol (VITAMIN D PO) Take 1 tablet by mouth daily.   hydrochlorothiazide (HYDRODIURIL) 25 MG tablet Take 1 tablet (25 mg total) by mouth daily.   lisinopril (ZESTRIL) 10 MG tablet Take 1 tablet (10 mg total) by mouth daily.   Multiple Vitamin (MULTIVITAMIN) tablet Take 1 tablet by mouth daily.     mupirocin ointment (BACTROBAN) 2 % Apply 1 application topically 3 (three) times daily.   vitamin C (ASCORBIC ACID) 500 MG tablet Take 500 mg by mouth daily.     No facility-administered encounter medications on file as of 08/27/2020.    Current Diagnosis: Patient Active Problem List   Diagnosis Date Noted   Alcohol consumption heavy 12/28/2019   Memory change 09/14/2018   Elevated glucose level 06/06/2017   Osteopenia 06/17/2016   Routine general medical examination at a health care facility 05/11/2016   Hypertension 04/02/2011   Hyperlipidemia 03/02/2011    Goals Addressed   None     Follow-Up:  Pharmacist Review   Have you seen any other providers since your last visit? The patient states that she has not seen any other provider since she last saw Dr. Sharlet Salina  Any changes in your medications or health? The patient states that there has been no changes in medications or health  Any side effects from any medications? The patient states that she has not side effects from medications  Do you have an  symptoms or problems not managed by your medications? The patient states that she has no symptoms or problems that are not managed by medications  Any concerns about your health right now? The patient has no concerns about her health at this time  Has your provider asked that you check blood pressure, blood sugar, or follow special diet at home? The patient states that she does not check her blood pressure or follow a special diet  Do you get any type of exercise on a regular basis? The patient states that she does not exercise  Can you think of a goal you would like to reach for your health? The patient states her goal is to stay healthy  Do you have any problems getting your medications? The patient does not have a problem with getting medications from the pharmacy Is there anything that you would like to discuss during the appointment? The patient states that she does not have anything specific to discuss at this time  Please bring medications and supplements to appointment   Wendy Poet, [place] [phone]

## 2020-08-28 ENCOUNTER — Ambulatory Visit: Payer: PPO | Admitting: Pharmacist

## 2020-08-28 ENCOUNTER — Other Ambulatory Visit: Payer: Self-pay

## 2020-08-28 DIAGNOSIS — E782 Mixed hyperlipidemia: Secondary | ICD-10-CM

## 2020-08-28 DIAGNOSIS — I10 Essential (primary) hypertension: Secondary | ICD-10-CM

## 2020-08-28 DIAGNOSIS — M85859 Other specified disorders of bone density and structure, unspecified thigh: Secondary | ICD-10-CM

## 2020-08-28 NOTE — Patient Instructions (Addendum)
Visit Information  Phone number for Pharmacist: [phone]  Thank you for meeting with me to discuss your medications! I look forward to working with you to achieve your health care goals. Below is a summary of what we talked about during the visit:  Goals Addressed            This Visit's Progress   . Manage My Medicine       Timeframe:  Long-Range Goal Priority:  Medium Start Date:     08/28/20                        Expected End Date:     02/25/21                   - call for medicine refill 2 or 3 days before it runs out - keep a list of all the medicines I take; vitamins and herbals too    Why is this important?   . These steps will help you keep on track with your medicines.    . Prevent Falls and Broken Bones-Osteoporosis       Timeframe:  Long-Range Goal Priority:  High Start Date:  08/28/20                           Expected End Date:         02/25/21               - always use handrails on the stairs - always wear shoes or slippers with non-slip sole - keep a flashlight by the bed - make an emergency alert plan in case I fall - pick up clutter from the floors - use a nightlight in the bathroom - wear low heeled or flat shoes with non-skid soles    Why is this important?    When you fall, there are 3 things that control if a bone breaks or not.   These are the fall itself, how hard and the direction that you fall and how fragile your bones are.   Preventing falls is very important for you because of fragile bones.        Patient Care Plan: CCM Pharmacy Care Plan    Problem Identified: HTN, HLD, Osteopenia   Priority: High    Goal: Patient-Specific Goal   Start Date: 08/28/2020  Expected End Date: 02/25/2021  This Visit's Progress: On track  Priority: High  Note:   Current Barriers:  . Unable to independently monitor therapeutic efficacy  Pharmacist Clinical Goal(s):  Marland Kitchen Over the next 180 days, patient will achieve adherence to monitoring guidelines  and medication adherence to achieve therapeutic efficacy through collaboration with PharmD and provider.   Interventions: . 1:1 collaboration with Hoyt Koch, MD regarding development and update of comprehensive plan of care as evidenced by provider attestation and co-signature . Inter-disciplinary care team collaboration (see longitudinal plan of care) . Comprehensive medication review performed; medication list updated in electronic medical record  Hypertension (BP goal <130/80) -controlled -Current treatment: . Amlodipine 5 mg daily . HCTZ 25 mg daily . Lisinopril 10 mg daily -Medications previously tried: n/a  -Current home readings: not checking -Denies hypotensive/hypertensive symptoms -Educated on BP goals and benefits of medications for prevention of heart attack, stroke and kidney damage; Symptoms of hypotension and importance of maintaining adequate hydration; -Counseled to monitor BP at home as needed, document, and provide log at future  appointments -Recommended to continue current medication  Hyperlipidemia: (LDL goal < 100) -controlled -Current treatment: . Atorvastatin 20 mg daily -Medications previously tried: n/a  -Educated on Cholesterol goals;  Benefits of statin for ASCVD risk reduction; -Recommended to continue current medication  Osteopenia (Goal: prevent fractures) -controlled -Last DEXA Scan: 06/14/2016   T-Score femoral neck: -2.0  T-Score lumbar spine: +0.9  10-year probability of major osteoporotic fracture: 13.5%  10-year probability of hip fracture: 3.6% -Patient is a candidate for pharmacologic treatment due to T-Score -1.0 to -2.5 and 10-year risk of hip fracture > 3% -Current treatment  . Calcium 500 mg . Vitamin D  -Medications previously tried: none  -Recommend [phone] units of vitamin D daily. Recommend 1200 mg of calcium daily from dietary and supplemental sources. -Recommended to continue current medication  Health  Maintenance -Current therapy:  . Multivitamin  . Vitamin C 500 mg daily -Educated on [place] has not proven benefit, ok to discontinue -Patient is satisfied with current therapy and denies issues -Recommended to continue current medication  Patient Goals/Self-Care Activities . Over the next 180 days, patient will:  - take medications as prescribed check blood pressure as needed, document, and provide at future appointments  Follow Up Plan: Telephone follow up appointment with care management team member scheduled for: 6 months     Anna Hendricks was given information about Chronic Care Management services today including:  1. CCM service includes personalized support from designated clinical staff supervised by her physician, including individualized plan of care and coordination with other care providers 2. 24/7 contact phone numbers for assistance for urgent and routine care needs. 3. Standard insurance, coinsurance, copays and deductibles apply for chronic care management only during months in which we provide at least 20 minutes of these services. Most insurances cover these services at 100%, however patients may be responsible for any copay, coinsurance and/or deductible if applicable. This service may help you avoid the need for more expensive face-to-face services. 4. Only one practitioner may furnish and bill the service in a calendar month. 5. The patient may stop CCM services at any time (effective at the end of the month) by phone call to the office staff.  Patient agreed to services and verbal consent obtained.   The patient verbalized understanding of instructions, educational materials, and care plan provided today and agreed to receive a mailed copy of patient instructions, educational materials, and care plan.  Telephone follow up appointment with pharmacy team member scheduled for: 6 months  Charlene Brooke, PharmD, BCACP Clinical Pharmacist [facility] protect organs, store calcium, anchor muscles, and support the whole body. Keeping your bones strong is important, especially as you get older. You can take actions to help keep your bones strong and healthy. Why is keeping my bones healthy important? Keeping your bones healthy is important because your body constantly replaces bone cells. Cells get old, and new cells take their place. As we age, we lose bone cells because the body may not be able to make enough new cells to replace the old cells. The amount of bone cells and bone tissue you have is referred to as bone mass. The higher your bone mass, the stronger your bones. The aging process leads to an overall loss of bone mass in the body, which can increase the likelihood of:  Joint pain and stiffness.  Broken bones.  A condition in which the bones become weak and brittle (  osteoporosis). A large decline in bone mass occurs in older adults. In women, it occurs about the time of menopause.   What actions can I take to keep my bones healthy? Good health habits are important for maintaining healthy bones. This includes eating nutritious foods and exercising regularly. To have healthy bones, you need to get enough of the right minerals and vitamins. Most nutrition experts recommend getting these nutrients from the foods that you eat. In some cases, taking supplements may also be recommended. Doing certain types of exercise is also important for bone health. What are the nutritional recommendations for healthy bones? Eating a well-balanced diet with plenty of calcium and vitamin D will help to protect your bones. Nutritional recommendations vary from person to person. Ask your health care provider what is healthy for you. Here are some general guidelines. Get enough calcium Calcium is the most important (essential) mineral for bone health. Most people can get enough calcium from their  diet, but supplements may be recommended for people who are at risk for osteoporosis. Good sources of calcium include:  Dairy products, such as low-fat or nonfat milk, cheese, and yogurt.  Dark green leafy vegetables, such as bok choy and broccoli.  Calcium-fortified foods, such as orange juice, cereal, bread, soy beverages, and tofu products.  Nuts, such as almonds. Follow these recommended amounts for daily calcium intake:  Children, age 2-3: 700 mg.  Children, age 9-8: 1,000 mg.  Children, age 29-13: 1,300 mg.  Teens, age 210-18: 1,300 mg.  Adults, age 95-50: 1,000 mg.  Adults, age 20-70: ? Men: 1,000 mg. ? Women: 1,200 mg.  Adults, age 77 or older: 1,200 mg.  Pregnant and breastfeeding females: ? Teens: 1,300 mg. ? Adults: 1,000 mg. Get enough vitamin D Vitamin D is the most essential vitamin for bone health. It helps the body absorb calcium. Sunlight stimulates the skin to make vitamin D, so be sure to get enough sunlight. If you live in a cold climate or you do not get outside often, your health care provider may recommend that you take vitamin D supplements. Good sources of vitamin D in your diet include:  Egg yolks.  Saltwater fish.  Milk and cereal fortified with vitamin D. Follow these recommended amounts for daily vitamin D intake:  Children and teens, age 2-18: 600 international units.  Adults, age 92 or younger: 400-800 international units.  Adults, age 65 or older: 800-1,000 international units. Get other important nutrients Other nutrients that are important for bone health include:  Phosphorus. This mineral is found in meat, poultry, dairy foods, nuts, and legumes. The recommended daily intake for adult men and adult women is 700 mg.  Magnesium. This mineral is found in seeds, nuts, dark green vegetables, and legumes. The recommended daily intake for adult men is 400-420 mg. For adult women, it is 310-320 mg.  Vitamin K. This vitamin is found in green  leafy vegetables. The recommended daily intake is 120 mg for adult men and 90 mg for adult women.   What type of physical activity is best for building and maintaining healthy bones? Weight-bearing and strength-building activities are important for building and maintaining healthy bones. Weight-bearing activities cause muscles and bones to work against gravity. Strength-building activities increase the strength of the muscles that support bones. Weight-bearing and muscle-building activities include:  Walking and hiking.  Jogging and running.  Dancing.  Gym exercises.  Lifting weights.  Tennis and racquetball.  Climbing stairs.  Aerobics. Adults should get at least  30 minutes of moderate physical activity on most days. Children should get at least 60 minutes of moderate physical activity on most days. Ask your health care provider what type of exercise is best for you.   How can I find out if my bone mass is low? Bone mass can be measured with an X-ray test called a bone mineral density (BMD) test. This test is recommended for all women who are age 30 or older. It may also be recommended for:  Men who are age 55 or older.  People who are at risk for osteoporosis because of: ? Having bones that break easily. ? Having a long-term disease that weakens bones, such as kidney disease or rheumatoid arthritis. ? Having menopause earlier than normal. ? Taking medicine that weakens bones, such as steroids, thyroid hormones, or hormone treatment for breast cancer or prostate cancer. ? Smoking. ? Drinking three or more alcoholic drinks a day. If you find that you have a low bone mass, you may be able to prevent osteoporosis or further bone loss by changing your diet and lifestyle. Where can I find more information? For more information, check out the following websites:  [employer]: AviationTales.fr  [employer] of Health: www.bones.SouthExposed.es  International  Osteoporosis Foundation: Administrator.iofbonehealth.org Summary  The aging process leads to an overall loss of bone mass in the body, which can increase the likelihood of broken bones and osteoporosis.  Eating a well-balanced diet with plenty of calcium and vitamin D will help to protect your bones.  Weight-bearing and strength-building activities are also important for building and maintaining strong bones.  Bone mass can be measured with an X-ray test called a bone mineral density (BMD) test. This information is not intended to replace advice given to you by your health care provider. Make sure you discuss any questions you have with your health care provider. Document Revised: 08/22/2017 Document Reviewed: 08/22/2017 Elsevier Patient Education  2021 [employer].

## 2020-08-28 NOTE — Chronic Care Management (AMB) (Signed)
Chronic Care Management Pharmacy Note  08/28/2020 Name:  Anna Hendricks MRN:  [id number] DOB:  [birth year]  Subjective: Anna Hendricks is an 80 y.o. year old female who is a primary patient of Hoyt Koch, MD.  The CCM team was consulted for assistance with disease management and care coordination needs.    Engaged with patient by telephone for initial visit in response to provider referral for pharmacy case management and/or care coordination services.   Consent to Services:  The patient was given the following information about Chronic Care Management services today, agreed to services, and gave verbal consent: 1. CCM service includes personalized support from designated clinical staff supervised by the primary care provider, including individualized plan of care and coordination with other care providers 2. 24/7 contact phone numbers for assistance for urgent and routine care needs. 3. Service will only be billed when office clinical staff spend 20 minutes or more in a month to coordinate care. 4. Only one practitioner may furnish and bill the service in a calendar month. 5.The patient may stop CCM services at any time (effective at the end of the month) by phone call to the office staff. 6. The patient will be responsible for cost sharing (co-pay) of up to 20% of the service fee (after annual deductible is met). Patient agreed to services and consent obtained.  Patient Care Team: Hoyt Koch, MD as PCP - General (Internal Medicine) Charlton Haws, [facility] (Pharmacist) Charlton Haws, [facility] as Pharmacist (Pharmacist)  Recent office visits: 12/27/19 Dr Sharlet Salina OV: chronic f/u. Heavy alcohol consumption discussed - 1 L wine per day. May be causing memory changes.  Recent consult visits: 10/05/18 Dr Leta Baptist (neurology): initial eval for memory loss. Advised to reduce alcohol intake (1L wine per day).  Objective:  Lab Results  Component Value Date   CREATININE  1.09 12/27/2019   BUN 23 12/27/2019   GFR 48.35 (L) 12/27/2019   NA 139 12/27/2019   K 3.5 12/27/2019   CALCIUM 9.3 12/27/2019   CO2 28 12/27/2019    Lab Results  Component Value Date/Time   HGBA1C 5.5 12/27/2019 02:39 PM   HGBA1C 5.7 09/14/2018 08:44 AM   GFR 48.35 (L) 12/27/2019 02:39 PM   GFR 51.79 (L) 09/14/2018 08:44 AM    Last diabetic Eye exam: No results found for: HMDIABEYEEXA  Last diabetic Foot exam: No results found for: HMDIABFOOTEX   Lab Results  Component Value Date   CHOL 186 12/27/2019   HDL 72.00 12/27/2019   LDLCALC 82 12/27/2019   LDLDIRECT 148.7 01/04/2013   TRIG 159.0 (H) 12/27/2019   CHOLHDL 3 12/27/2019    Hepatic Function Latest Ref Rng & Units 12/27/2019 09/14/2018 07/07/2017  Total Protein 6.0 - 8.3 g/dL 7.2 7.1 -  Albumin 3.5 - 5.2 g/dL 4.3 4.3 4.1  AST 0 - 37 U/L 19 14 -  ALT 0 - 35 U/L 14 14 -  Alk Phosphatase 39 - 117 U/L 66 62 -  Total Bilirubin 0.2 - 1.2 mg/dL 0.6 0.7 -    Lab Results  Component Value Date/Time   TSH 2.42 06/02/2017 08:42 AM   TSH 0.95 11/20/2013 08:30 AM    CBC Latest Ref Rng & Units 12/27/2019 09/14/2018 06/02/2017  WBC 4.0 - 10.5 K/uL 8.7 8.9 8.2  Hemoglobin 12.0 - 15.0 g/dL 13.3 13.4 13.7  Hematocrit 36.0 - 46.0 % 39.3 39.2 40.9  Platelets 150.0 - 400.0 K/uL 395.0 352.0 421.0(H)    No results found  for: VD25OH  Clinical ASCVD: No  The ASCVD Risk score Mikey Bussing DC Jr., et al., 2013) failed to calculate for the following reasons:   The 2013 ASCVD risk score is only valid for ages 53 to 40     BP Readings from Last 3 Encounters:  12/27/19 118/60  10/05/18 104/64  09/14/18 122/62   Pulse Readings from Last 3 Encounters:  12/27/19 68  10/05/18 73  09/14/18 70   Wt Readings from Last 3 Encounters:  12/27/19 152 lb (68.9 kg)  10/05/18 149 lb 9.6 oz (67.9 kg)  09/14/18 150 lb (68 kg)    Assessment/Interventions: Review of patient past medical history, allergies, medications, health status, including review  of consultants reports, laboratory and other test data, was performed as part of comprehensive evaluation and provision of chronic care management services.   SDOH:  (Social Determinants of Health) assessments and interventions performed:  SDOH Interventions   Flowsheet Row Most Recent Value  SDOH Interventions   Financial Strain Interventions Intervention Not Indicated      CCM Care Plan  No Known Allergies  Medications Reviewed Today    Reviewed by Charlton Haws, [facility] (Pharmacist) on 08/28/20 at 44  Med List Status: <None>  Medication Order Taking? Sig Documenting Provider Last Dose Status Informant  amLODipine (NORVASC) 5 MG tablet [id number] Yes Take 1 tablet (5 mg total) by mouth daily. Hoyt Koch, MD Taking Active   atorvastatin (LIPITOR) 20 MG tablet [id number] Yes Take 1 tablet (20 mg total) by mouth daily. Hoyt Koch, MD Taking Active   CALCIUM-VITAMIN D PO [id number] Yes Take by mouth as directed. [provider] Taking Active   Cholecalciferol (VITAMIN D PO) [id number] Yes Take 1,000 Units by mouth daily. [provider] Taking Active   hydrochlorothiazide (HYDRODIURIL) 25 MG tablet [id number] Yes Take 1 tablet (25 mg total) by mouth daily. Hoyt Koch, MD Taking Active   lisinopril (ZESTRIL) 10 MG tablet [id number] Yes Take 1 tablet (10 mg total) by mouth daily. Hoyt Koch, MD Taking Active   Multiple Vitamin (MULTIVITAMIN) tablet [id number] Yes Take 1 tablet by mouth daily. [provider] Taking Active           Patient Active Problem List   Diagnosis Date Noted  . Alcohol consumption heavy 12/28/2019  . Memory change 09/14/2018  . Elevated glucose level 06/06/2017  . Osteopenia 06/17/2016  . Routine general medical examination at a health care facility 05/11/2016  . Hypertension 04/02/2011  . Hyperlipidemia 03/02/2011    Immunization History  Administered Date(s) Administered  . Influenza Split  05/14/2011  . Influenza,inj,Quad PF,6+ Mos 05/11/2016  . Influenza,inj,quad, With Preservative 05/18/2017  . Influenza-Unspecified 05/09/2013, 04/09/2017, 05/09/2018, 05/02/2019  . PFIZER(Purple Top)SARS-COV-2 Vaccination 05/05/2020  . Pneumococcal Conjugate-13 11/27/2013  . Pneumococcal Polysaccharide-23 05/11/2016  . Tdap 05/04/2018  . Zoster Recombinat (Shingrix) 09/15/2018    Conditions to be addressed/monitored:  HTN, HLD and Osteopenia  Patient Care Plan: CCM Pharmacy Care Plan    Problem Identified: HTN, HLD, Osteopenia   Priority: High    Goal: Patient-Specific Goal   Start Date: 08/28/2020  Expected End Date: 02/25/2021  This Visit's Progress: On track  Priority: High  Note:   Current Barriers:  . Unable to independently monitor therapeutic efficacy  Pharmacist Clinical Goal(s):  Marland Kitchen Over the next 180 days, patient will achieve adherence to monitoring guidelines and medication adherence to achieve therapeutic efficacy through collaboration with PharmD and provider.   Interventions: . 1:1 collaboration with  Hoyt Koch, MD regarding development and update of comprehensive plan of care as evidenced by provider attestation and co-signature . Inter-disciplinary care team collaboration (see longitudinal plan of care) . Comprehensive medication review performed; medication list updated in electronic medical record  Hypertension (BP goal <130/80) -controlled -Current treatment: . Amlodipine 5 mg daily . HCTZ 25 mg daily . Lisinopril 10 mg daily -Medications previously tried: n/a  -Current home readings: not checking -Denies hypotensive/hypertensive symptoms -Educated on BP goals and benefits of medications for prevention of heart attack, stroke and kidney damage; Symptoms of hypotension and importance of maintaining adequate hydration; -Counseled to monitor BP at home as needed, document, and provide log at future appointments -Recommended to continue current  medication  Hyperlipidemia: (LDL goal < 100) -controlled -Current treatment: . Atorvastatin 20 mg daily -Medications previously tried: n/a  -Educated on Cholesterol goals;  Benefits of statin for ASCVD risk reduction; -Recommended to continue current medication  Osteopenia (Goal: prevent fractures) -controlled -Last DEXA Scan: 06/14/2016   T-Score femoral neck: -2.0  T-Score lumbar spine: +0.9  10-year probability of major osteoporotic fracture: 13.5%  10-year probability of hip fracture: 3.6% -Patient is a candidate for pharmacologic treatment due to T-Score -1.0 to -2.5 and 10-year risk of hip fracture > 3% -Current treatment  . Calcium 500 mg . Vitamin D  -Medications previously tried: none  -Recommend [phone] units of vitamin D daily. Recommend 1200 mg of calcium daily from dietary and supplemental sources. -Recommended to continue current medication  Health Maintenance -Current therapy:  . Multivitamin  . Vitamin C 500 mg daily -Educated on Mar-Mac has not proven benefit, ok to discontinue -Patient is satisfied with current therapy and denies issues -Recommended to continue current medication  Patient Goals/Self-Care Activities . Over the next 180 days, patient will:  - take medications as prescribed check blood pressure as needed, document, and provide at future appointments  Follow Up Plan: Telephone follow up appointment with care management team member scheduled for: 6 months     Medication Assistance: None required.  Patient affirms current coverage meets needs.  Patient's [facility]  [id number]: [phone]: [phone] Uses pill box? No - husband sets out meds for her Pt endorses 100% compliance  We discussed: Current pharmacy is preferred with insurance plan and patient is satisfied with pharmacy services  Plan:  Continue current medication management strategy    Follow Up:  Patient agrees to Care Plan and Follow-up.  Plan: Telephone follow up appointment with care management team member scheduled for:  6 months  LCharlene Brooke PharmD, BSurgcenter CamelbackClinical Pharmacist [facility]Mor[facility] FallsPrimary [facility] at [phone]

## 2020-09-08 ENCOUNTER — Telehealth: Payer: Self-pay | Admitting: Pharmacist

## 2020-09-08 NOTE — Progress Notes (Signed)
° ° °  Chronic Care Management Pharmacy Assistant   Name: Anna Hendricks  MRN: [id number] DOB: [birth year]  Reason for Encounter: Chart Review   PCP : Hoyt Koch, MD  Allergies:  No Known Allergies  Medications: Outpatient Encounter Medications as of 09/08/2020  Medication Sig   amLODipine (NORVASC) 5 MG tablet Take 1 tablet (5 mg total) by mouth daily.   atorvastatin (LIPITOR) 20 MG tablet Take 1 tablet (20 mg total) by mouth daily.   CALCIUM-VITAMIN D PO Take by mouth as directed.   Cholecalciferol (VITAMIN D PO) Take 1,000 Units by mouth daily.   hydrochlorothiazide (HYDRODIURIL) 25 MG tablet Take 1 tablet (25 mg total) by mouth daily.   lisinopril (ZESTRIL) 10 MG tablet Take 1 tablet (10 mg total) by mouth daily.   Multiple Vitamin (MULTIVITAMIN) tablet Take 1 tablet by mouth daily.   No facility-administered encounter medications on file as of 09/08/2020.    Current Diagnosis: Patient Active Problem List   Diagnosis Date Noted   Alcohol consumption heavy 12/28/2019   Memory change 09/14/2018   Elevated glucose level 06/06/2017   Osteopenia 06/17/2016   Routine general medical examination at a health care facility 05/11/2016   Hypertension 04/02/2011   Hyperlipidemia 03/02/2011    Goals Addressed   None     Follow-Up:  Pharmacist Review   Reviewed chart for medication changes and adherence. Patient is to continue to take medications as prescribed and to monitor blood pressure as needed.  No gaps in adherence identified. Patient has follow up scheduled with pharmacy team. No further action required.   Wendy Poet, [place] [phone]

## 2020-10-01 ENCOUNTER — Encounter: Payer: Self-pay | Admitting: Internal Medicine

## 2020-10-01 ENCOUNTER — Other Ambulatory Visit: Payer: Self-pay

## 2020-10-01 ENCOUNTER — Ambulatory Visit (INDEPENDENT_AMBULATORY_CARE_PROVIDER_SITE_OTHER): Payer: PPO | Admitting: Internal Medicine

## 2020-10-01 VITALS — BP 122/80 | HR 80 | Temp 98.3°F | Resp 18 | Ht 61.5 in | Wt 154.4 lb

## 2020-10-01 DIAGNOSIS — R1310 Dysphagia, unspecified: Secondary | ICD-10-CM

## 2020-10-01 LAB — CBC
HCT: 40.2 % (ref 36.0–46.0)
Hemoglobin: 13.9 g/dL (ref 12.0–15.0)
MCHC: 34.7 g/dL (ref 30.0–36.0)
MCV: 93.3 fl (ref 78.0–100.0)
Platelets: 382 10*3/uL (ref 150.0–400.0)
RBC: 4.31 Mil/uL (ref 3.87–5.11)
RDW: 13.6 % (ref 11.5–15.5)
WBC: 7.8 10*3/uL (ref 4.0–10.5)

## 2020-10-01 NOTE — Patient Instructions (Signed)
We are checking the blood work today and will send in either a 1 pill a day for acid reducing or the 1 pill twice a day for the bacteria.

## 2020-10-01 NOTE — Progress Notes (Signed)
   Subjective:   Patient ID: Anna Hendricks, female    DOB: 08/24/1940, 80 y.o.   MRN: [id number]  HPI The patient is an 80 YO female coming in with her husband to discuss problems with food getting stuck and some nausea. More with solids. She does not have problems with liquids. Does not think she has acid reflux but her husband is concerned about ulcers. Not taking anything for it. No blood in stool.   Review of Systems  Constitutional: Negative.   HENT: Positive for trouble swallowing.   Eyes: Negative.   Respiratory: Negative for cough, chest tightness and shortness of breath.   Cardiovascular: Negative for chest pain, palpitations and leg swelling.  Gastrointestinal: Negative for abdominal distention, abdominal pain, constipation, diarrhea, nausea and vomiting.  Musculoskeletal: Negative.   Skin: Negative.   Neurological: Negative.   Psychiatric/Behavioral: Negative.     Objective:  Physical Exam Constitutional:      Appearance: She is well-developed and well-nourished.  HENT:     Head: Normocephalic and atraumatic.  Eyes:     Extraocular Movements: EOM normal.  Cardiovascular:     Rate and Rhythm: Normal rate and regular rhythm.  Pulmonary:     Effort: Pulmonary effort is normal. No respiratory distress.     Breath sounds: Normal breath sounds. No wheezing or rales.  Abdominal:     General: Bowel sounds are normal. There is no distension.     Palpations: Abdomen is soft.     Tenderness: There is no abdominal tenderness. There is no rebound.  Musculoskeletal:        General: No edema.     Cervical back: Normal range of motion.  Skin:    General: Skin is warm and dry.  Neurological:     Mental Status: She is alert and oriented to person, place, and time.     Coordination: Coordination normal.  Psychiatric:        Mood and Affect: Mood and affect normal.     Vitals:   10/01/20 0949  BP: 122/80  Pulse: 80  Resp: 18  Temp: 98.3 F (36.8 C)  TempSrc: Oral   SpO2: 97%  Weight: 154 lb 6.4 oz (70 kg)  Height: 5' 1.5" (1.562 m)    This visit occurred during the SARS-CoV-2 public health emergency.  Safety protocols were in place, including screening questions prior to the visit, additional usage of staff PPE, and extensive cleaning of exam room while observing appropriate contact time as indicated for disinfecting solutions.   Assessment & Plan:

## 2020-10-03 ENCOUNTER — Other Ambulatory Visit: Payer: Self-pay | Admitting: Internal Medicine

## 2020-10-03 DIAGNOSIS — R1310 Dysphagia, unspecified: Secondary | ICD-10-CM | POA: Insufficient documentation

## 2020-10-03 LAB — H PYLORI, IGM, IGG, IGA AB
H pylori, IgM Abs: <9 units (ref 0.0–8.9)
H. pylori, IgA Abs: <9 units (ref 0.0–8.9)
H. pylori, IgG AbS: 0.29 Index Value (ref 0.00–0.79)

## 2020-10-03 MED ORDER — PANTOPRAZOLE SODIUM 40 MG PO TBEC
40.0000 mg | DELAYED_RELEASE_TABLET | Freq: Every day | ORAL | 3 refills | Status: DC
Start: 2020-10-03 — End: 2021-01-01

## 2020-10-03 NOTE — Assessment & Plan Note (Signed)
Checking h. Pylori and treat if positive and CBC. If negative will start PPI and reassess in 1-2 months.

## 2020-10-15 ENCOUNTER — Telehealth: Payer: Self-pay | Admitting: Internal Medicine

## 2020-10-15 NOTE — Telephone Encounter (Signed)
Please call to discuss lab results 

## 2020-10-15 NOTE — Telephone Encounter (Signed)
Unable to get in contact with the patient. No option to leave a voicemail due to the phone constantly ringing. Results were mailed to patient's house last week.

## 2020-12-24 ENCOUNTER — Other Ambulatory Visit: Payer: Self-pay | Admitting: Internal Medicine

## 2021-01-01 ENCOUNTER — Other Ambulatory Visit: Payer: Self-pay | Admitting: Internal Medicine

## 2021-01-15 ENCOUNTER — Other Ambulatory Visit: Payer: Self-pay | Admitting: Internal Medicine

## 2021-01-20 ENCOUNTER — Other Ambulatory Visit: Payer: Self-pay

## 2021-01-20 ENCOUNTER — Ambulatory Visit (INDEPENDENT_AMBULATORY_CARE_PROVIDER_SITE_OTHER): Payer: PPO

## 2021-01-20 VITALS — BP 120/70 | HR 86 | Temp 98.2°F | Ht 62.0 in | Wt 151.0 lb

## 2021-01-20 DIAGNOSIS — Z0000 Encounter for general adult medical examination without abnormal findings: Secondary | ICD-10-CM | POA: Diagnosis not present

## 2021-01-20 NOTE — Progress Notes (Signed)
Subjective:   Anna Hendricks is a 80 y.o. female who presents for Medicare Annual (Subsequent) preventive examination.  Review of Systems     Cardiac Risk Factors include: advanced age (>30men, >56 women);dyslipidemia;family history of premature cardiovascular disease;hypertension     Objective:    Today's Vitals   01/20/21 1051  BP: 120/70  Pulse: 86  Temp: 98.2 F (36.8 C)  SpO2: 96%  Weight: 151 lb (68.5 kg)  Height: 5\' 2"  (1.575 m)  PainSc: 0-No pain   Body mass index is 27.62 kg/m.  Advanced Directives 01/20/2021 06/02/2017  Does Patient Have a Medical Advance Directive? No No  Would patient like information on creating a medical advance directive? No - Patient declined No - Patient declined    Current Medications (verified) Outpatient Encounter Medications as of 01/20/2021  Medication Sig   amLODipine (NORVASC) 5 MG tablet Take 1 tablet (5 mg total) by mouth daily. Please call our office to schedule your physical to receive additional refills.   atorvastatin (LIPITOR) 20 MG tablet Take 1 tablet (20 mg total) by mouth daily. Please call our office to schedule your physical to receive additional refills.   CALCIUM-VITAMIN D PO Take by mouth as directed.   Cholecalciferol (VITAMIN D PO) Take 1,000 Units by mouth daily.   hydrochlorothiazide (HYDRODIURIL) 25 MG tablet Take 1 tablet (25 mg total) by mouth daily. Please call our office to schedule your physical to receive additional refills.   lisinopril (ZESTRIL) 10 MG tablet Take 1 tablet (10 mg total) by mouth daily. Please call our office to schedule your physical to receive additional refills.   Multiple Vitamin (MULTIVITAMIN) tablet Take 1 tablet by mouth daily.   pantoprazole (PROTONIX) 40 MG tablet TAKE 1 TABLET BY MOUTH EVERY DAY   No facility-administered encounter medications on file as of 01/20/2021.    Allergies (verified) Patient has no known allergies.   History: Past Medical History:  Diagnosis Date    Arthritis    GERD (gastroesophageal reflux disease)    History of chicken pox    Hyperlipidemia    Hypertension    Past Surgical History:  Procedure Laterality Date   APPENDECTOMY  1955   COLONOSCOPY     DILATION AND CURETTAGE OF UTERUS     ELBOW SURGERY  2007   Family History  Problem Relation Age of Onset   Arthritis Mother    Hyperlipidemia Mother    Stroke Mother    Hypertension Father    Cancer Maternal Aunt        breast CA   Social History   Socioeconomic History   Marital status: Married    Spouse name: Not on file   Number of children: Not on file   Years of education: Not on file   Highest education level: Not on file  Occupational History   Not on file  Tobacco Use   Smoking status: Former    Pack years: 0.00    Types: Cigarettes    Quit date: 08/10/1987    Years since quitting: 33.4   Smokeless tobacco: Never  Substance and Sexual Activity   Alcohol use: Yes    Alcohol/week: 4.0 standard drinks    Types: 4 Glasses of wine per week    Comment: rare   Drug use: No   Sexual activity: Not on file  Other Topics Concern   Not on file  Social History Narrative   Married.  Lives with husband Anna Hendricks.     Social Determinants  of Health   Financial Resource Strain: Low Risk    Difficulty of Paying Living Expenses: Not hard at all  Food Insecurity: No Food Insecurity   Worried About [place] in the Last Year: Never true   [place] in the Last Year: Never true  Transportation Needs: No Transportation Needs   Lack of Transportation (Medical): No   Lack of Transportation (Non-Medical): No  Physical Activity: Inactive   Days of Exercise per Week: 0 days   Minutes of Exercise per Session: 0 min  Stress: No Stress Concern Present   Feeling of Stress : Not at all  Social Connections: Socially Integrated   Frequency of Communication with Friends and Family: More than three times a week   Frequency of Social Gatherings with Friends and  Family: More than three times a week   Attends Religious Services: More than 4 times per year   Active Member of [employer] or Organizations: No   Attends Music therapist: More than 4 times per year   Marital Status: Married    Tobacco Counseling Counseling given: Not Answered   Clinical Intake:  Pre-visit preparation completed: Yes  Pain : No/denies pain Pain Score: 0-No pain     BMI - recorded: 27.62 Nutritional Status: BMI 25 -29 Overweight Nutritional Risks: None Diabetes: No  How often do you need to have someone help you when you read instructions, pamphlets, or other written materials from your doctor or pharmacy?: 1 - Never What is the last grade level you completed in school?: High School Graduate  Diabetic? no  Interpreter Needed?: No  Information entered by :: Anna Abu, LPN   Activities of Daily Living In your present state of health, do you have any difficulty performing the following activities: 01/20/2021 10/01/2020  Hearing? N N  Vision? N N  Difficulty concentrating or making decisions? N N  Walking or climbing stairs? N N  Dressing or bathing? N N  Doing errands, shopping? Y N  Preparing Food and eating ? N -  Using the Toilet? N -  In the past six months, have you accidently leaked urine? N -  Do you have problems with loss of bowel control? N -  Managing your Medications? N -  Managing your Finances? N -  Housekeeping or managing your Housekeeping? N -  Some recent data might be hidden    Patient Care Team: Hoyt Koch, MD as PCP - General (Internal Medicine) Charlton Haws, [facility] (Pharmacist) Charlton Haws, [facility] as Pharmacist (Pharmacist) Katy Fitch, Darlina Guys, MD as Consulting Physician (Ophthalmology)  Indicate any recent Medical Services you may have received from other than [facility] providers in the past year (date may be approximate).     Assessment:   This is a routine wellness examination for  Anna Hendricks.  Hearing/Vision screen Hearing Screening - Comments:: Patient denies hearing difficulty. Vision Screening - Comments:: Patient denies any vision problems.  Dietary issues and exercise activities discussed: Current Exercise Habits: The patient does not participate in regular exercise at present   Goals Addressed             This Visit's Progress    Patient Stated       Stay healthy and independent as possible.       Depression Screen PHQ 2/9 Scores 01/20/2021 12/27/2019 05/04/2018 06/02/2017 11/27/2013 10/05/2012  PHQ - 2 Score 0 0 0 0 0 0  PHQ- 9 Score - - - 0 - -  Fall Risk Fall Risk  01/20/2021 12/27/2019 05/04/2018 06/02/2017 11/27/2013  Falls in the past year? 0 0 No No Yes  Number falls in past yr: 0 0 - - 1  Injury with Fall? 0 0 - - No  Risk for fall due to : No Fall Risks - - - -  Follow up Falls evaluation completed - - - -    FALL RISK PREVENTION PERTAINING TO THE HOME:  Any stairs in or around the home? No  If so, are there any without handrails? No  Home free of loose throw rugs in walkways, pet beds, electrical cords, etc? Yes  Adequate lighting in your home to reduce risk of falls? Yes   ASSISTIVE DEVICES UTILIZED TO PREVENT FALLS:  Life alert? No  Use of a cane, walker or w/c? No  Grab bars in the bathroom? Yes  Shower chair or bench in shower? Yes  Elevated toilet seat or a handicapped toilet? No   TIMED UP AND GO:  Was the test performed? Yes .  Length of time to ambulate 10 feet: 8 sec.   Gait steady and fast without use of assistive device  Cognitive Function: MMSE - Mini Mental State Exam 10/05/2018 06/02/2017  Orientation to time 5 5  Orientation to Place 4 5  Registration 3 3  Attention/ Calculation 4 0  Recall 1 2  Recall-comments - unable to recall 1 of 3 words  Language- name 2 objects 2 0  Language- repeat 1 1  Language- follow 3 step command 3 3  Language- read & follow direction 1 0  Write a sentence 1 0  Copy design 1  0  Total score 26 19        Immunizations Immunization History  Administered Date(s) Administered   Fluad Quad(high Dose 65+) 05/08/2020   Influenza Split 05/14/2011   Influenza,inj,Quad PF,6+ Mos 05/11/2016   Influenza,inj,quad, With Preservative 05/18/2017   Influenza-Unspecified 05/09/2013, 04/09/2017, 05/09/2018, 05/02/2019   PFIZER Comirnaty(Gray Top)Covid-19 Tri-Sucrose Vaccine 11/07/2020   PFIZER(Purple Top)SARS-COV-2 Vaccination 08/28/2019, 09/17/2019, 05/05/2020   Pneumococcal Conjugate-13 11/27/2013   Pneumococcal Polysaccharide-23 05/11/2016   Tdap 05/04/2018   Zoster Recombinat (Shingrix) 03/23/2018, 09/15/2018   Zoster, Live 09/07/2005    TDAP status: Up to date  Flu Vaccine status: Up to date  Pneumococcal vaccine status: Up to date  Covid-19 vaccine status: Completed vaccines  Qualifies for Shingles Vaccine? Yes   Zostavax completed Yes   Shingrix Completed?: Yes  Screening Tests Health Maintenance  Topic Date Due   MAMMOGRAM  05/21/2017   INFLUENZA VACCINE  03/09/2021   TETANUS/TDAP  05/04/2028   DEXA SCAN  Completed   COVID-19 Vaccine  Completed   PNA vac Low Risk Adult  Completed   Zoster Vaccines- Shingrix  Completed   HPV VACCINES  Aged Out    Health Maintenance  Health Maintenance Due  Topic Date Due   MAMMOGRAM  05/21/2017    Colorectal cancer screening: No longer required.   Mammogram status: No longer required due to age.  Bone Density status: Completed 06/14/2016. Results reflect: Bone density results: OSTEOPENIA. Repeat every 2 years.  Lung Cancer Screening: (Low Dose CT Chest recommended if Age 44-80 years, 30 pack-year currently smoking OR have quit w/in 15years.) does not qualify.   Lung Cancer Screening Referral: no  Additional Screening:  Hepatitis C Screening: does not qualify; Completed no  Vision Screening: Recommended annual ophthalmology exams for early detection of glaucoma and other disorders of the eye. Is the  patient  up to date with their annual eye exam?  Yes  Who is the provider or what is the name of the office in which the patient attends annual eye exams? Darlina Guys at, MD. If pt is not established with a provider, would they like to be referred to a provider to establish care? No .   Dental Screening: Recommended annual dental exams for proper oral hygiene  Community Resource Referral / Chronic Care Management: CRR required this visit?  No   CCM required this visit?  No      Plan:     I have personally reviewed and noted the following in the patient's chart:   Medical and social history Use of alcohol, tobacco or illicit drugs  Current medications and supplements including opioid prescriptions.  Functional ability and status Nutritional status Physical activity Advanced directives List of other physicians Hospitalizations, surgeries, and ER visits in previous 12 months Vitals Screenings to include cognitive, depression, and falls Referrals and appointments  In addition, I have reviewed and discussed with patient certain preventive protocols, quality metrics, and best practice recommendations. A written personalized care plan for preventive services as well as general preventive health recommendations were provided to patient.     Sheral Flow, LPN   9/39/0300   Nurse Notes:  Medications reviewed with patient; no opioid use noted.

## 2021-01-20 NOTE — Patient Instructions (Signed)
Anna Hendricks , Thank you for taking time to come for your Medicare Wellness Visit. I appreciate your ongoing commitment to your health goals. Please review the following plan we discussed and let me know if I can assist you in the future.   Screening recommendations/referrals: Colonoscopy: no repeat due to age; last done 04/16/2011 Mammogram: last done 05/21/2016 Bone Density: last done 06/14/2016 Recommended yearly ophthalmology/optometry visit for glaucoma screening and checkup Recommended yearly dental visit for hygiene and checkup  Vaccinations: Influenza vaccine: 05/08/2020 Pneumococcal vaccine: 11/27/2013, 05/11/2016 Tdap vaccine: 05/04/2018; due very 10 years Shingles vaccine: 09/15/2018   Covid-19: 08/28/2019, 09/17/2019, 05/05/2020, 11/07/2020  Advanced directives: Advance directive discussed with you today. Even though you declined this today please call our office should you change your mind and we can give you the proper paperwork for you to fill out.  Conditions/risks identified: Stay healthy and independent.  Next appointment: Please schedule your next Medicare Wellness Visit with your Nurse Health Advisor in 1 year by calling [phone].   Preventive Care 48 Years and Older, Female Preventive care refers to lifestyle choices and visits with your health care provider that can promote health and wellness. What does preventive care include? A yearly physical exam. This is also called an annual well check. Dental exams once or twice a year. Routine eye exams. Ask your health care provider how often you should have your eyes checked. Personal lifestyle choices, including: Daily care of your teeth and gums. Regular physical activity. Eating a healthy diet. Avoiding tobacco and drug use. Limiting alcohol use. Practicing safe sex. Taking low-dose aspirin every day. Taking vitamin and mineral supplements as recommended by your health care provider. What happens during an annual well  check? The services and screenings done by your health care provider during your annual well check will depend on your age, overall health, lifestyle risk factors, and family history of disease. Counseling  Your health care provider may ask you questions about your: Alcohol use. Tobacco use. Drug use. Emotional well-being. Home and relationship well-being. Sexual activity. Eating habits. History of falls. Memory and ability to understand (cognition). Work and work Statistician. Reproductive health. Screening  You may have the following tests or measurements: Height, weight, and BMI. Blood pressure. Lipid and cholesterol levels. These may be checked every 5 years, or more frequently if you are over 55 years old. Skin check. Lung cancer screening. You may have this screening every year starting at age 29 if you have a 30-pack-year history of smoking and currently smoke or have quit within the past 15 years. Fecal occult blood test (FOBT) of the stool. You may have this test every year starting at age 36. Flexible sigmoidoscopy or colonoscopy. You may have a sigmoidoscopy every 5 years or a colonoscopy every 10 years starting at age 45. Hepatitis C blood test. Hepatitis B blood test. Sexually transmitted disease (STD) testing. Diabetes screening. This is done by checking your blood sugar (glucose) after you have not eaten for a while (fasting). You may have this done every 1-3 years. Bone density scan. This is done to screen for osteoporosis. You may have this done starting at age 93. Mammogram. This may be done every 1-2 years. Talk to your health care provider about how often you should have regular mammograms. Talk with your health care provider about your test results, treatment options, and if necessary, the need for more tests. Vaccines  Your health care provider may recommend certain vaccines, such as: Influenza vaccine. This is recommended  every year. Tetanus, diphtheria, and  acellular pertussis (Tdap, Td) vaccine. You may need a Td booster every 10 years. Zoster vaccine. You may need this after age 10. Pneumococcal 13-valent conjugate (PCV13) vaccine. One dose is recommended after age 78. Pneumococcal polysaccharide (PPSV23) vaccine. One dose is recommended after age 32. Talk to your health care provider about which screenings and vaccines you need and how often you need them. This information is not intended to replace advice given to you by your health care provider. Make sure you discuss any questions you have with your health care provider. Document Released: 08/22/2015 Document Revised: 04/14/2016 Document Reviewed: 05/27/2015 Elsevier Interactive Patient Education  2017 [place] Prevention in the Home Falls can cause injuries. They can happen to people of all ages. There are many things you can do to make your home safe and to help prevent falls. What can I do on the outside of my home? Regularly fix the edges of walkways and driveways and fix any cracks. Remove anything that might make you trip as you walk through a door, such as a raised step or threshold. Trim any bushes or trees on the path to your home. Use bright outdoor lighting. Clear any walking paths of anything that might make someone trip, such as rocks or tools. Regularly check to see if handrails are loose or broken. Make sure that both sides of any steps have handrails. Any raised decks and porches should have guardrails on the edges. Have any leaves, snow, or ice cleared regularly. Use sand or salt on walking paths during winter. Clean up any spills in your garage right away. This includes oil or grease spills. What can I do in the bathroom? Use night lights. Install grab bars by the toilet and in the tub and shower. Do not use towel bars as grab bars. Use non-skid mats or decals in the tub or shower. If you need to sit down in the shower, use a plastic, non-slip stool. Keep  the floor dry. Clean up any water that spills on the floor as soon as it happens. Remove soap buildup in the tub or shower regularly. Attach bath mats securely with double-sided non-slip rug tape. Do not have throw rugs and other things on the floor that can make you trip. What can I do in the bedroom? Use night lights. Make sure that you have a light by your bed that is easy to reach. Do not use any sheets or blankets that are too big for your bed. They should not hang down onto the floor. Have a firm chair that has side arms. You can use this for support while you get dressed. Do not have throw rugs and other things on the floor that can make you trip. What can I do in the kitchen? Clean up any spills right away. Avoid walking on wet floors. Keep items that you use a lot in easy-to-reach places. If you need to reach something above you, use a strong step stool that has a grab bar. Keep electrical cords out of the way. Do not use floor polish or wax that makes floors slippery. If you must use wax, use non-skid floor wax. Do not have throw rugs and other things on the floor that can make you trip. What can I do with my stairs? Do not leave any items on the stairs. Make sure that there are handrails on both sides of the stairs and use them. Fix handrails that are broken  or loose. Make sure that handrails are as long as the stairways. Check any carpeting to make sure that it is firmly attached to the stairs. Fix any carpet that is loose or worn. Avoid having throw rugs at the top or bottom of the stairs. If you do have throw rugs, attach them to the floor with carpet tape. Make sure that you have a light switch at the top of the stairs and the bottom of the stairs. If you do not have them, ask someone to add them for you. What else can I do to help prevent falls? Wear shoes that: Do not have high heels. Have rubber bottoms. Are comfortable and fit you well. Are closed at the toe. Do not  wear sandals. If you use a stepladder: Make sure that it is fully opened. Do not climb a closed stepladder. Make sure that both sides of the stepladder are locked into place. Ask someone to hold it for you, if possible. Clearly mark and make sure that you can see: Any grab bars or handrails. First and last steps. Where the edge of each step is. Use tools that help you move around (mobility aids) if they are needed. These include: Canes. Walkers. Scooters. Crutches. Turn on the lights when you go into a dark area. Replace any light bulbs as soon as they burn out. Set up your furniture so you have a clear path. Avoid moving your furniture around. If any of your floors are uneven, fix them. If there are any pets around you, be aware of where they are. Review your medicines with your doctor. Some medicines can make you feel dizzy. This can increase your chance of falling. Ask your doctor what other things that you can do to help prevent falls. This information is not intended to replace advice given to you by your health care provider. Make sure you discuss any questions you have with your health care provider. Document Released: 05/22/2009 Document Revised: 01/01/2016 Document Reviewed: 08/30/2014 Elsevier Interactive Patient Education  2017 [employer].

## 2021-02-12 ENCOUNTER — Other Ambulatory Visit: Payer: Self-pay | Admitting: Internal Medicine

## 2021-02-25 ENCOUNTER — Telehealth: Payer: PPO

## 2021-03-02 ENCOUNTER — Ambulatory Visit (INDEPENDENT_AMBULATORY_CARE_PROVIDER_SITE_OTHER): Payer: PPO | Admitting: Internal Medicine

## 2021-03-02 ENCOUNTER — Encounter: Payer: Self-pay | Admitting: Internal Medicine

## 2021-03-02 ENCOUNTER — Other Ambulatory Visit: Payer: Self-pay

## 2021-03-02 VITALS — BP 122/80 | HR 91 | Temp 98.5°F | Resp 18 | Ht 62.0 in | Wt 159.4 lb

## 2021-03-02 DIAGNOSIS — I10 Essential (primary) hypertension: Secondary | ICD-10-CM | POA: Diagnosis not present

## 2021-03-02 DIAGNOSIS — Z0000 Encounter for general adult medical examination without abnormal findings: Secondary | ICD-10-CM

## 2021-03-02 DIAGNOSIS — E782 Mixed hyperlipidemia: Secondary | ICD-10-CM | POA: Diagnosis not present

## 2021-03-02 DIAGNOSIS — R413 Other amnesia: Secondary | ICD-10-CM | POA: Diagnosis not present

## 2021-03-02 DIAGNOSIS — Z789 Other specified health status: Secondary | ICD-10-CM | POA: Diagnosis not present

## 2021-03-02 MED ORDER — HYDROCHLOROTHIAZIDE 25 MG PO TABS: 25.0000 mg | ORAL_TABLET | Freq: Every day | ORAL | 3 refills | Status: DC

## 2021-03-02 MED ORDER — ATORVASTATIN CALCIUM 20 MG PO TABS: 20.0000 mg | ORAL_TABLET | Freq: Every day | ORAL | 3 refills | Status: DC

## 2021-03-02 MED ORDER — LISINOPRIL 10 MG PO TABS: 10.0000 mg | ORAL_TABLET | Freq: Every day | ORAL | 3 refills | Status: DC

## 2021-03-02 MED ORDER — PANTOPRAZOLE SODIUM 40 MG PO TBEC: 40.0000 mg | DELAYED_RELEASE_TABLET | Freq: Every day | ORAL | 3 refills | Status: DC

## 2021-03-02 MED ORDER — AMLODIPINE BESYLATE 5 MG PO TABS: 5.0000 mg | ORAL_TABLET | Freq: Every day | ORAL | 3 refills | Status: DC

## 2021-03-02 NOTE — Patient Instructions (Addendum)
We will check the labs today. 

## 2021-03-02 NOTE — Assessment & Plan Note (Signed)
Stable from prior. Husband does most complex tasks.

## 2021-03-02 NOTE — Assessment & Plan Note (Signed)
BP at goal on amlodipine 5 mg daily, hctz 25 mg daily, lisinopril 10 mg daily. Checking CMP and adjust as needed.

## 2021-03-02 NOTE — Progress Notes (Signed)
   Subjective:   Patient ID: Anna Hendricks, female    DOB: 04-07-1941, 80 y.o.   MRN: [id number]  HPI The patient is an 80 YO female coming in for physical.   PMH, [place], social history reviewed and updated  Review of Systems  Constitutional: Negative.   HENT: Negative.    Eyes: Negative.   Respiratory:  Negative for cough, chest tightness and shortness of breath.   Cardiovascular:  Negative for chest pain, palpitations and leg swelling.  Gastrointestinal:  Negative for abdominal distention, abdominal pain, constipation, diarrhea, nausea and vomiting.  Musculoskeletal: Negative.   Skin: Negative.   Neurological: Negative.   Psychiatric/Behavioral: Negative.     Objective:  Physical Exam Constitutional:      Appearance: She is well-developed. She is obese.  HENT:     Head: Normocephalic and atraumatic.  Cardiovascular:     Rate and Rhythm: Normal rate and regular rhythm.  Pulmonary:     Effort: Pulmonary effort is normal. No respiratory distress.     Breath sounds: Normal breath sounds. No wheezing or rales.  Abdominal:     General: Bowel sounds are normal. There is no distension.     Palpations: Abdomen is soft.     Tenderness: There is no abdominal tenderness. There is no rebound.  Musculoskeletal:     Cervical back: Normal range of motion.  Skin:    General: Skin is warm and dry.  Neurological:     Mental Status: She is alert and oriented to person, place, and time.     Coordination: Coordination normal.    Vitals:   03/02/21 1314  BP: 122/80  Pulse: 91  Resp: 18  Temp: 98.5 F (36.9 C)  TempSrc: Oral  SpO2: 97%  Weight: 159 lb 6.4 oz (72.3 kg)  Height: '5\' 2"'$  (1.575 m)    This visit occurred during the SARS-CoV-2 public health emergency.  Safety protocols were in place, including screening questions prior to the visit, additional usage of staff PPE, and extensive cleaning of exam room while observing appropriate contact time as indicated for disinfecting  solutions.   Assessment & Plan:

## 2021-03-02 NOTE — Assessment & Plan Note (Signed)
Checking lipid panel and adjust lipitor 20 mg daily as needed. Refilled today. 

## 2021-03-02 NOTE — Assessment & Plan Note (Signed)
Flu shot yearly. Covid-19 3 shots getting 4th soon. Pneumonia complete. Shingrix complete. Tetanus due 2029. Colonoscopy aged out. Mammogram aged out, pap smear aged out and dexa declines further. Counseled about sun safety and mole surveillance. Counseled about the dangers of distracted driving. Given 10 year screening recommendations.

## 2021-03-02 NOTE — Assessment & Plan Note (Signed)
Still with similar alcohol consumption 1L wine daily which she does not want to stop.

## 2021-03-23 ENCOUNTER — Ambulatory Visit: Payer: PPO | Admitting: Internal Medicine

## 2021-05-19 ENCOUNTER — Encounter: Payer: Self-pay | Admitting: Gastroenterology

## 2021-07-23 ENCOUNTER — Telehealth: Payer: Self-pay

## 2021-07-23 NOTE — Chronic Care Management (AMB) (Signed)
° ° °  Chronic Care Management Pharmacy Assistant   Name: Anna Hendricks  MRN: [id number] DOB: [birth year]   Reason for Encounter: Disease State-General     Recent office visits:  03/02/21 Hoyt Koch, MD-PCP (Routine general medical examination at a health care facility) Labs ordered, no med changes  10/01/20 Hoyt Koch, MD-PCP (Dysphagia) Ordered: H Pylori, IGM, IGG,IGA AB, CBC, no med changes Recent consult visits:  None ID  Hospital visits:  None in previous 6 months  Medications: Outpatient Encounter Medications as of 07/23/2021  Medication Sig   amLODipine (NORVASC) 5 MG tablet Take 1 tablet (5 mg total) by mouth daily.   atorvastatin (LIPITOR) 20 MG tablet Take 1 tablet (20 mg total) by mouth daily. Marland Kitchen   CALCIUM-VITAMIN D PO Take by mouth as directed.   Cholecalciferol (VITAMIN D PO) Take 1,000 Units by mouth daily.   hydrochlorothiazide (HYDRODIURIL) 25 MG tablet Take 1 tablet (25 mg total) by mouth daily.   lisinopril (ZESTRIL) 10 MG tablet Take 1 tablet (10 mg total) by mouth daily.   Multiple Vitamin (MULTIVITAMIN) tablet Take 1 tablet by mouth daily.   pantoprazole (PROTONIX) 40 MG tablet Take 1 tablet (40 mg total) by mouth daily.   No facility-administered encounter medications on file as of 07/23/2021.   Have you had any problems recently with your health?Spoke with patient husband who states that the patient does not have any new health issues, she is doing fine.  Have you had any problems with your pharmacy?He stated that she doe not have any problems with getting medications or the cost of medications from the pharmacy.  What issues or side effects are you having with your medications?He stated that she does not have any side effects from medications that he knows of.  What would you like me to pass along to [facility] for them to help you with? He stated to let him know she is doing well and does not have any complaints about her  health  What can we do to take care of you better? He stated he appreciates the call but the patient does not need anything at this time  Care Gaps: Colonoscopy-NA Diabetic Foot Exam-NA Mammogram-NA Ophthalmology-NA Dexa Scan - NA Annual Well Visit -  Micro albumin-NA  Star Rating Drugs: Atorvastatin 20 mg-last fill 05/29/21 90 ds Lisinopril 10 mg-last fill 05/29/21 90 ds  Ethelene Hal Clinical Pharmacist Assistant [phone]

## 2021-09-28 ENCOUNTER — Ambulatory Visit (INDEPENDENT_AMBULATORY_CARE_PROVIDER_SITE_OTHER): Payer: PPO

## 2021-09-28 DIAGNOSIS — I10 Essential (primary) hypertension: Secondary | ICD-10-CM

## 2021-09-28 DIAGNOSIS — E782 Mixed hyperlipidemia: Secondary | ICD-10-CM

## 2021-09-28 NOTE — Patient Instructions (Signed)
Visit Information  Following are the goals we discussed today:   Manage My Medicine   Timeframe:  Long-Range Goal Priority:  Medium Start Date:     08/28/20                        Expected End Date:     09/28/2022                   - call for medicine refill 2 or 3 days before it runs out - keep a list of all the medicines I take; vitamins and herbals too - learn to read medicine labels   Plan: Telephone follow up appointment with care management team member scheduled for:  12 months  The patient has been provided with contact information for the care management team and has been advised to call with any health related questions or concerns.   Tomasa Blase, PharmD Clinical Pharmacist, [facility]   Please call the care guide team at [phone] if you need to cancel or reschedule your appointment.   The patient verbalized understanding of instructions, educational materials, and care plan provided today and declined offer to receive copy of patient instructions, educational materials, and care plan.

## 2021-09-28 NOTE — Progress Notes (Cosign Needed)
Chronic Care Management Pharmacy Note  09/28/2021 Name:  Anna Hendricks MRN:  [id number] DOB:  Apr 03, 1941  Summary: -Patient reports that she is doing well, denies any issues or concerns with current medications, reports adherence to current medications  Recommendations/Changes made from today's visit: -BP and LDL controlled with last check - recommending no changes to medications at this time, patient to reach out with any medication issues or concerns   Plan: -f/u in 12 months   Subjective: Anna Hendricks is an 81 y.o. year old female who is a primary patient of Hoyt Koch, MD.  The CCM team was consulted for assistance with disease management and care coordination needs.    Engaged with patient by telephone for follow up visit in response to provider referral for pharmacy case management and/or care coordination services.   Consent to Services:  The patient was given the following information about Chronic Care Management services today, agreed to services, and gave verbal consent: 1. CCM service includes personalized support from designated clinical staff supervised by the primary care provider, including individualized plan of care and coordination with other care providers 2. 24/7 contact phone numbers for assistance for urgent and routine care needs. 3. Service will only be billed when office clinical staff spend 20 minutes or more in a month to coordinate care. 4. Only one practitioner may furnish and bill the service in a calendar month. 5.The patient may stop CCM services at any time (effective at the end of the month) by phone call to the office staff. 6. The patient will be responsible for cost sharing (co-pay) of up to 20% of the service fee (after annual deductible is met). Patient agreed to services and consent obtained.  Patient Care Team: Hoyt Koch, MD as PCP - General (Internal Medicine) Debbra Riding, MD as Consulting Physician  (Ophthalmology) Tomasa Blase, [facility] (Pharmacist)  Recent office visits: 03/02/2021 - Dr. Sharlet Salina - no changes to medications - f/u in 1 year   Recent consult visits: None in previous 6 months   Hospital visits: None in previous 6 months  Objective:  Lab Results  Component Value Date   CREATININE 1.09 12/27/2019   BUN 23 12/27/2019   GFR 48.35 (L) 12/27/2019   NA 139 12/27/2019   K 3.5 12/27/2019   CALCIUM 9.3 12/27/2019   CO2 28 12/27/2019   GLUCOSE 87 12/27/2019    Lab Results  Component Value Date/Time   HGBA1C 5.5 12/27/2019 02:39 PM   HGBA1C 5.7 09/14/2018 08:44 AM   GFR 48.35 (L) 12/27/2019 02:39 PM   GFR 51.79 (L) 09/14/2018 08:44 AM    Last diabetic Eye exam:  No results found for: HMDIABEYEEXA  Last diabetic Foot exam:  No results found for: HMDIABFOOTEX   Lab Results  Component Value Date   CHOL 186 12/27/2019   HDL 72.00 12/27/2019   LDLCALC 82 12/27/2019   LDLDIRECT 148.7 01/04/2013   TRIG 159.0 (H) 12/27/2019   CHOLHDL 3 12/27/2019    Hepatic Function Latest Ref Rng & Units 12/27/2019 09/14/2018 07/07/2017  Total Protein 6.0 - 8.3 g/dL 7.2 7.1 -  Albumin 3.5 - 5.2 g/dL 4.3 4.3 4.1  AST 0 - 37 U/L 19 14 -  ALT 0 - 35 U/L 14 14 -  Alk Phosphatase 39 - 117 U/L 66 62 -  Total Bilirubin 0.2 - 1.2 mg/dL 0.6 0.7 -    Lab Results  Component Value Date/Time   TSH 2.42 06/02/2017 08:42 AM  TSH 0.95 11/20/2013 08:30 AM    CBC Latest Ref Rng & Units 10/01/2020 12/27/2019 09/14/2018  WBC 4.0 - 10.5 K/uL 7.8 8.7 8.9  Hemoglobin 12.0 - 15.0 g/dL 13.9 13.3 13.4  Hematocrit 36.0 - 46.0 % 40.2 39.3 39.2  Platelets 150.0 - 400.0 K/uL 382.0 395.0 352.0    No results found for: VD25OH  Clinical ASCVD: No  The ASCVD Risk score (Arnett DK, et al., 2019) failed to calculate for the following reasons:   The 2019 ASCVD risk score is only valid for ages 82 to 43    Depression screen PHQ 2/9 01/20/2021 12/27/2019 05/04/2018  Decreased Interest 0 0 0  Down,  Depressed, Hopeless 0 0 0  PHQ - 2 Score 0 0 0  Altered sleeping - - -  Tired, decreased energy - - -  Change in appetite - - -  Feeling bad or failure about yourself  - - -  Trouble concentrating - - -  Moving slowly or fidgety/restless - - -  Suicidal thoughts - - -  PHQ-9 Score - - -  Difficult doing work/chores - - -    Social History   Tobacco Use  Smoking Status Former   Types: Cigarettes   Quit date: 08/10/1987   Years since quitting: 34.1  Smokeless Tobacco Never   BP Readings from Last 3 Encounters:  03/02/21 122/80  01/20/21 120/70  10/01/20 122/80   Pulse Readings from Last 3 Encounters:  03/02/21 91  01/20/21 86  10/01/20 80   Wt Readings from Last 3 Encounters:  03/02/21 159 lb 6.4 oz (72.3 kg)  01/20/21 151 lb (68.5 kg)  10/01/20 154 lb 6.4 oz (70 kg)   BMI Readings from Last 3 Encounters:  03/02/21 29.15 kg/m  01/20/21 27.62 kg/m  10/01/20 28.70 kg/m    Assessment/Interventions: Review of patient past medical history, allergies, medications, health status, including review of consultants reports, laboratory and other test data, was performed as part of comprehensive evaluation and provision of chronic care management services.   SDOH:  (Social Determinants of Health) assessments and interventions performed: Yes  SDOH Screenings   Alcohol Screen: Low Risk    Last Alcohol Screening Score (AUDIT): 4  Depression (PHQ2-9): Low Risk    PHQ-2 Score: 0  Financial Resource Strain: Low Risk    Difficulty of Paying Living Expenses: Not hard at all  Food Insecurity: No Food Insecurity   Worried About Charity fundraiser in the Last Year: Never true   Ran Out of Food in the Last Year: Never true  Housing: Low Risk    Last Housing Risk Score: 0  Physical Activity: Inactive   Days of Exercise per Week: 0 days   Minutes of Exercise per Session: 0 min  Social Connections: Engineer, building services of Communication with Friends and Family: More than  three times a week   Frequency of Social Gatherings with Friends and Family: More than three times a week   Attends Religious Services: More than 4 times per year   Active Member of [employer] or Organizations: No   Attends Music therapist: More than 4 times per year   Marital Status: Married  Stress: No Stress Concern Present   Feeling of Stress : Not at all  Tobacco Use: Medium Risk   Smoking Tobacco Use: Former   Smokeless Tobacco Use: Never   Passive Exposure: Not on Pensions consultant Needs: No Data processing manager (Medical): No  Lack of Transportation (Non-Medical): No    CCM Care Plan  No Known Allergies  Medications Reviewed Today     Reviewed by Tomasa Blase, [facility] (Pharmacist) on 09/28/21 at 1320  Med List Status: <None>   Medication Order Taking? Sig Documenting Provider Last Dose Status Informant  amLODipine (NORVASC) 5 MG tablet [id number] Yes Take 1 tablet (5 mg total) by mouth daily. Hoyt Koch, MD Taking Active   atorvastatin (LIPITOR) 20 MG tablet [id number] Yes Take 1 tablet (20 mg total) by mouth daily. Hoyt Koch, MD Taking Active   Calcium Carbonate (CALCIUM 600 PO) [id number] Yes Take by mouth. [provider] Taking Active   Cholecalciferol (VITAMIN D PO) [id number] Yes Take 1,000 Units by mouth daily. [provider] Taking Active   hydrochlorothiazide (HYDRODIURIL) 25 MG tablet [id number] Yes Take 1 tablet (25 mg total) by mouth daily. Hoyt Koch, MD Taking Active   lisinopril (ZESTRIL) 10 MG tablet [id number] Yes Take 1 tablet (10 mg total) by mouth daily. Hoyt Koch, MD Taking Active   Multiple Vitamin (MULTIVITAMIN) tablet [id number] Yes Take 1 tablet by mouth daily. [provider] Taking Active   pantoprazole (PROTONIX) 40 MG tablet [id number] No Take 1 tablet (40 mg total) by mouth daily.  Patient not taking: Reported on 09/28/2021   Hoyt Koch, MD Not Taking Active             Patient Active Problem List   Diagnosis Date Noted   Dysphagia 10/03/2020   Alcohol consumption heavy 12/28/2019   Memory change 09/14/2018   Osteopenia 06/17/2016   Routine general medical examination at a health care facility 05/11/2016   Hypertension 04/02/2011   Hyperlipidemia 03/02/2011    Immunization History  Administered Date(s) Administered   Fluad Quad(high Dose 65+) 05/08/2020   Influenza Split 05/14/2011   Influenza, High Dose Seasonal PF 04/28/2021   Influenza,inj,Quad PF,6+ Mos 05/11/2016   Influenza,inj,quad, With Preservative 05/18/2017   Influenza-Unspecified 05/09/2013, 04/09/2017, 05/09/2018, 05/02/2019   PFIZER Comirnaty(Gray Top)Covid-19 Tri-Sucrose Vaccine 11/07/2020   PFIZER(Purple Top)SARS-COV-2 Vaccination 08/28/2019, 09/17/2019, 05/05/2020   Pfizer Covid-19 Vaccine Bivalent Booster 68yr & up 04/15/2021   Pneumococcal Conjugate-13 11/27/2013   Pneumococcal Polysaccharide-23 05/11/2016   Tdap 05/04/2018   Zoster Recombinat (Shingrix) 03/23/2018, 09/15/2018   Zoster, Live 09/07/2005    Conditions to be addressed/monitored:  Hypertension, Hyperlipidemia, and Osteopenia  Care Plan : [place] Updates made by STomasa Blase RPH since 09/28/2021 12:00 AM     Problem: HTN, HLD, Osteopenia   Priority: High     Long-Range Goal: Disease Management   Start Date: 08/28/2020  Expected End Date: 09/28/2022  This Visit's Progress: On track  Recent Progress: On track  Priority: High  Note:   Current Barriers:  Unable to independently monitor therapeutic efficacy  Pharmacist Clinical Goal(s):  Over the next 180 days, patient will achieve adherence to monitoring guidelines and medication adherence to achieve therapeutic efficacy through collaboration with PharmD and provider.   Interventions: 1:1 collaboration with CHoyt Koch MD regarding development and update of comprehensive plan of  care as evidenced by provider attestation and co-signature Inter-disciplinary care team collaboration (see longitudinal plan of care) Comprehensive medication review performed; medication list updated in electronic medical record  Hypertension (BP goal <130/80) -controlled -Current treatment: Amlodipine 5 mg daily HCTZ 25 mg daily Lisinopril 10 mg daily -Medications previously tried: n/a  -Current home readings: not checking BP Readings from Last 3 Encounters:  03/02/21 122/80  01/20/21 120/70  10/01/20 122/80  -Denies hypotensive/hypertensive symptoms -Educated on BP goals and benefits of medications for prevention of heart attack, stroke and kidney damage; Symptoms of hypotension and importance of maintaining adequate hydration; -Counseled to monitor BP at home as needed, document, and provide log at future appointments -Recommended to continue current medication  Hyperlipidemia: (LDL goal < 100) -controlled Lab Results  Component Value Date   LDLCALC 82 12/27/2019  -Current treatment: Atorvastatin 20 mg daily -Medications previously tried: n/a  -Educated on Cholesterol goals;  Benefits of statin for ASCVD risk reduction; -Recommended to continue current medication  Osteopenia (Goal: prevent fractures) -controlled -Last DEXA Scan: 06/14/2016   T-Score femoral neck: -2.0  T-Score lumbar spine: +0.9  10-year probability of major osteoporotic fracture: 13.5%  10-year probability of hip fracture: 3.6% -Patient is a candidate for pharmacologic treatment due to T-Score -1.0 to -2.5 and 10-year risk of hip fracture > 3% -Current treatment  Calcium 600 mg daily  Vitamin D 1000 units daily  -Medications previously tried: none  -Recommend [id number] units of vitamin D daily. Recommend 1200 mg of calcium daily from dietary and supplemental sources. -Recommended to continue current medication  Health Maintenance -Current therapy:  Multivitamin  -Patient is satisfied with  current therapy and denies issues -Recommended to continue current medication  Patient Goals/Self-Care Activities Patient will:  - take medications as prescribed check blood pressure as needed, document, and provide at future appointments  Follow Up Plan: Telephone follow up appointment with care management team member scheduled for: 12 months       Medication Assistance: None required.  Patient affirms current coverage meets needs.  Care Gaps: N/a  Patient's preferred pharmacy is:  [id number]: [phone]: [phone]  Uses pill box? No - husband organizes and sets out her medications  Pt endorses 100% compliance  Care Plan and Follow Up Patient Decision:  Patient agrees to Care Plan and Follow-up.  Plan: Telephone follow up appointment with care management team member scheduled for:  12 months The patient has been provided with contact information for the care management team and has been advised to call with any health related questions or concerns.   DTomasa Blase PharmD Clinical Pharmacist, [place]

## 2021-10-06 DIAGNOSIS — E782 Mixed hyperlipidemia: Secondary | ICD-10-CM | POA: Diagnosis not present

## 2021-10-06 DIAGNOSIS — I10 Essential (primary) hypertension: Secondary | ICD-10-CM | POA: Diagnosis not present

## 2022-01-29 ENCOUNTER — Ambulatory Visit: Payer: PPO

## 2022-02-19 ENCOUNTER — Ambulatory Visit (INDEPENDENT_AMBULATORY_CARE_PROVIDER_SITE_OTHER): Payer: PPO

## 2022-02-19 DIAGNOSIS — Z0000 Encounter for general adult medical examination without abnormal findings: Secondary | ICD-10-CM

## 2022-02-19 NOTE — Patient Instructions (Signed)
Anna Hendricks , Thank you for taking time to come for your Medicare Wellness Visit. I appreciate your ongoing commitment to your health goals. Please review the following plan we discussed and let me know if I can assist you in the future.   Screening recommendations/referrals: Colonoscopy: no longer required  Mammogram: no longer required  Bone Density: 06/14/2016 Recommended yearly ophthalmology/optometry visit for glaucoma screening and checkup Recommended yearly dental visit for hygiene and checkup  Vaccinations: Influenza vaccine: completed  Pneumococcal vaccine: completed  Tdap vaccine: 05/04/2018 Shingles vaccine: completed     Advanced directives: none   Conditions/risks identified: none   Next appointment: none    Preventive Care 15 Years and Older, Female Preventive care refers to lifestyle choices and visits with your health care provider that can promote health and wellness. What does preventive care include? A yearly physical exam. This is also called an annual well check. Dental exams once or twice a year. Routine eye exams. Ask your health care provider how often you should have your eyes checked. Personal lifestyle choices, including: Daily care of your teeth and gums. Regular physical activity. Eating a healthy diet. Avoiding tobacco and drug use. Limiting alcohol use. Practicing safe sex. Taking low-dose aspirin every day. Taking vitamin and mineral supplements as recommended by your health care provider. What happens during an annual well check? The services and screenings done by your health care provider during your annual well check will depend on your age, overall health, lifestyle risk factors, and family history of disease. Counseling  Your health care provider may ask you questions about your: Alcohol use. Tobacco use. Drug use. Emotional well-being. Home and relationship well-being. Sexual activity. Eating habits. History of falls. Memory and  ability to understand (cognition). Work and work Statistician. Reproductive health. Screening  You may have the following tests or measurements: Height, weight, and BMI. Blood pressure. Lipid and cholesterol levels. These may be checked every 5 years, or more frequently if you are over 26 years old. Skin check. Lung cancer screening. You may have this screening every year starting at age 1 if you have a 30-pack-year history of smoking and currently smoke or have quit within the past 15 years. Fecal occult blood test (FOBT) of the stool. You may have this test every year starting at age 32. Flexible sigmoidoscopy or colonoscopy. You may have a sigmoidoscopy every 5 years or a colonoscopy every 10 years starting at age 4. Hepatitis C blood test. Hepatitis B blood test. Sexually transmitted disease (STD) testing. Diabetes screening. This is done by checking your blood sugar (glucose) after you have not eaten for a while (fasting). You may have this done every 1-3 years. Bone density scan. This is done to screen for osteoporosis. You may have this done starting at age 63. Mammogram. This may be done every 1-2 years. Talk to your health care provider about how often you should have regular mammograms. Talk with your health care provider about your test results, treatment options, and if necessary, the need for more tests. Vaccines  Your health care provider may recommend certain vaccines, such as: Influenza vaccine. This is recommended every year. Tetanus, diphtheria, and acellular pertussis (Tdap, Td) vaccine. You may need a Td booster every 10 years. Zoster vaccine. You may need this after age 18. Pneumococcal 13-valent conjugate (PCV13) vaccine. One dose is recommended after age 43. Pneumococcal polysaccharide (PPSV23) vaccine. One dose is recommended after age 43. Talk to your health care provider about which screenings and vaccines  you need and how often you need them. This information is  not intended to replace advice given to you by your health care provider. Make sure you discuss any questions you have with your health care provider. Document Released: 08/22/2015 Document Revised: 04/14/2016 Document Reviewed: 05/27/2015 Elsevier Interactive Patient Education  2017 [employer] Prevention in the Home Falls can cause injuries. They can happen to people of all ages. There are many things you can do to make your home safe and to help prevent falls. What can I do on the outside of my home? Regularly fix the edges of walkways and driveways and fix any cracks. Remove anything that might make you trip as you walk through a door, such as a raised step or threshold. Trim any bushes or trees on the path to your home. Use bright outdoor lighting. Clear any walking paths of anything that might make someone trip, such as rocks or tools. Regularly check to see if handrails are loose or broken. Make sure that both sides of any steps have handrails. Any raised decks and porches should have guardrails on the edges. Have any leaves, snow, or ice cleared regularly. Use sand or salt on walking paths during winter. Clean up any spills in your garage right away. This includes oil or grease spills. What can I do in the bathroom? Use night lights. Install grab bars by the toilet and in the tub and shower. Do not use towel bars as grab bars. Use non-skid mats or decals in the tub or shower. If you need to sit down in the shower, use a plastic, non-slip stool. Keep the floor dry. Clean up any water that spills on the floor as soon as it happens. Remove soap buildup in the tub or shower regularly. Attach bath mats securely with double-sided non-slip rug tape. Do not have throw rugs and other things on the floor that can make you trip. What can I do in the bedroom? Use night lights. Make sure that you have a light by your bed that is easy to reach. Do not use any sheets or blankets that  are too big for your bed. They should not hang down onto the floor. Have a firm chair that has side arms. You can use this for support while you get dressed. Do not have throw rugs and other things on the floor that can make you trip. What can I do in the kitchen? Clean up any spills right away. Avoid walking on wet floors. Keep items that you use a lot in easy-to-reach places. If you need to reach something above you, use a strong step stool that has a grab bar. Keep electrical cords out of the way. Do not use floor polish or wax that makes floors slippery. If you must use wax, use non-skid floor wax. Do not have throw rugs and other things on the floor that can make you trip. What can I do with my stairs? Do not leave any items on the stairs. Make sure that there are handrails on both sides of the stairs and use them. Fix handrails that are broken or loose. Make sure that handrails are as long as the stairways. Check any carpeting to make sure that it is firmly attached to the stairs. Fix any carpet that is loose or worn. Avoid having throw rugs at the top or bottom of the stairs. If you do have throw rugs, attach them to the floor with carpet tape. Make sure  that you have a light switch at the top of the stairs and the bottom of the stairs. If you do not have them, ask someone to add them for you. What else can I do to help prevent falls? Wear shoes that: Do not have high heels. Have rubber bottoms. Are comfortable and fit you well. Are closed at the toe. Do not wear sandals. If you use a stepladder: Make sure that it is fully opened. Do not climb a closed stepladder. Make sure that both sides of the stepladder are locked into place. Ask someone to hold it for you, if possible. Clearly mark and make sure that you can see: Any grab bars or handrails. First and last steps. Where the edge of each step is. Use tools that help you move around (mobility aids) if they are needed. These  include: Canes. Walkers. Scooters. Crutches. Turn on the lights when you go into a dark area. Replace any light bulbs as soon as they burn out. Set up your furniture so you have a clear path. Avoid moving your furniture around. If any of your floors are uneven, fix them. If there are any pets around you, be aware of where they are. Review your medicines with your doctor. Some medicines can make you feel dizzy. This can increase your chance of falling. Ask your doctor what other things that you can do to help prevent falls. This information is not intended to replace advice given to you by your health care provider. Make sure you discuss any questions you have with your health care provider. Document Released: 05/22/2009 Document Revised: 01/01/2016 Document Reviewed: 08/30/2014 Elsevier Interactive Patient Education  2017 [employer].

## 2022-02-19 NOTE — Progress Notes (Signed)
Subjective:   Anna Hendricks is a 81 y.o. female who presents for Medicare Annual (Subsequent) preventive examination.   I connected with Anna Hendricks le  today by telephone and verified that I am speaking with the correct person using two identifiers. Location patient: home Location provider: work Persons participating in the virtual visit: patient, provider.   I discussed the limitations, risks, security and privacy concerns of performing an evaluation and management service by telephone and the availability of in person appointments. I also discussed with the patient that there may be a patient responsible charge related to this service. The patient expressed understanding and verbally consented to this telephonic visit.    Interactive audio and video telecommunications were attempted between this provider and patient, however failed, due to patient having technical difficulties OR patient did not have access to video capability.  We continued and completed visit with audio only.    Review of Systems     Cardiac Risk Factors include: advanced age (>4mn, >>78women)     Objective:    Today's Vitals   There is no height or weight on file to calculate BMI.     02/19/2022    2:18 PM 01/20/2021   11:31 AM 06/02/2017    8:50 AM  Advanced Directives  Does Patient Have a Medical Advance Directive? Yes No No  Type of AParamedicof ASomisLiving will    Copy of [place]? No - copy requested    Would patient like information on creating a medical advance directive?  No - Patient declined No - Patient declined    Current Medications (verified) Outpatient Encounter Medications as of 02/19/2022  Medication Sig   amLODipine (NORVASC) 5 MG tablet Take 1 tablet (5 mg total) by mouth daily.   atorvastatin (LIPITOR) 20 MG tablet Take 1 tablet (20 mg total) by mouth daily. .   Calcium Carbonate (CALCIUM 600 PO) Take by mouth.    Cholecalciferol (VITAMIN D PO) Take 1,000 Units by mouth daily.   hydrochlorothiazide (HYDRODIURIL) 25 MG tablet Take 1 tablet (25 mg total) by mouth daily.   lisinopril (ZESTRIL) 10 MG tablet Take 1 tablet (10 mg total) by mouth daily.   Multiple Vitamin (MULTIVITAMIN) tablet Take 1 tablet by mouth daily.   pantoprazole (PROTONIX) 40 MG tablet Take 1 tablet (40 mg total) by mouth daily.   No facility-administered encounter medications on file as of 02/19/2022.    Allergies (verified) Patient has no known allergies.   History: Past Medical History:  Diagnosis Date   Arthritis    GERD (gastroesophageal reflux disease)    History of chicken pox    Hyperlipidemia    Hypertension    Past Surgical History:  Procedure Laterality Date   APPENDECTOMY  1955   COLONOSCOPY     DILATION AND CURETTAGE OF UTERUS     ELBOW SURGERY  2007   Family History  Problem Relation Age of Onset   Arthritis Mother    Hyperlipidemia Mother    Stroke Mother    Hypertension Father    Cancer Maternal Aunt        breast CA   Social History   Socioeconomic History   Marital status: Married    Spouse name: Not on file   Number of children: Not on file   Years of education: Not on file   Highest education level: Not on file  Occupational History   Not on file  Tobacco Use  Smoking status: Former    Types: Cigarettes    Quit date: 08/10/1987    Years since quitting: 34.5   Smokeless tobacco: Never  Substance and Sexual Activity   Alcohol use: Yes    Alcohol/week: 4.0 standard drinks of alcohol    Types: 4 Glasses of wine per week    Comment: rare   Drug use: No   Sexual activity: Not on file  Other Topics Concern   Not on file  Social History Narrative   Married.  Lives with husband Anna Hendricks.     Social Determinants of Health   Financial Resource Strain: Low Risk  (02/19/2022)   Overall Financial Resource Strain (CARDIA)    Difficulty of Paying Living Expenses: Not hard at all  Food  Insecurity: No Food Insecurity (02/19/2022)   Hunger Vital Sign    Worried About Running Out of Food in the Last Year: Never true    Ran Out of Food in the Last Year: Never true  Transportation Needs: No Transportation Needs (02/19/2022)   PRAPARE - Hydrologist (Medical): No    Lack of Transportation (Non-Medical): No  Physical Activity: Insufficiently Active (02/19/2022)   Exercise Vital Sign    Days of Exercise per Week: 3 days    Minutes of Exercise per Session: 30 min  Stress: No Stress Concern Present (02/19/2022)   [place]    Feeling of Stress : Not at all  Social Connections: [place] (02/19/2022)   Social Connection and Isolation Panel [NHANES]    Frequency of Communication with Friends and Family: Three times a week    Frequency of Social Gatherings with Friends and Family: Three times a week    Attends Religious Services: More than 4 times per year    Active Member of Clubs or Organizations: Yes    Attends Archivist Meetings: 1 to 4 times per year    Marital Status: Married    Tobacco Counseling Counseling given: Not Answered   Clinical Intake:  Pre-visit preparation completed: Yes  Pain : No/denies pain     Nutritional Risks: None Diabetes: No  How often do you need to have someone help you when you read instructions, pamphlets, or other written materials from your doctor or pharmacy?: 1 - Never What is the last grade level you completed in school?: college  Diabetic?no   Interpreter Needed?: No  Information entered by :: [place] of Daily Living    02/19/2022    2:22 PM  In your present state of health, do you have any difficulty performing the following activities:  Hearing? 0  Vision? 0  Difficulty concentrating or making decisions? 0  Walking or climbing stairs? 0  Dressing or bathing? 0  Doing errands, shopping? 0   Preparing Food and eating ? N  Using the Toilet? N  In the past six months, have you accidently leaked urine? N  Do you have problems with loss of bowel control? N  Managing your Medications? N  Managing your Finances? N  Housekeeping or managing your Housekeeping? N    Patient Care Team: Hoyt Koch, MD as PCP - General (Internal Medicine) Katy Fitch, Darlina Guys, MD as Consulting Physician (Ophthalmology) Szabat, Darnelle Maffucci, [facility] (Inactive) (Pharmacist)  Indicate any recent Medical Services you may have received from other than [facility] providers in the past year (date may be approximate).     Assessment:   This  is a routine wellness examination for [place].  Hearing/Vision screen Vision Screening - Comments:: Annual eye exams wears glasses   Dietary issues and exercise activities discussed: Current Exercise Habits: Home exercise routine, Type of exercise: walking, Time (Minutes): 30, Frequency (Times/Week): 3, Weekly Exercise (Minutes/Week): 90, Intensity: Mild, Exercise limited by: None identified   Goals Addressed   None    Depression Screen    02/19/2022    2:22 PM 02/19/2022    2:21 PM 01/20/2021   11:30 AM 12/27/2019    1:52 PM 05/04/2018    3:00 PM 06/02/2017    8:48 AM 11/27/2013   12:07 PM  PHQ 2/9 Scores  PHQ - 2 Score 0 0 0 0 0 0 0  PHQ- 9 Score      0     Fall Risk    02/19/2022    2:21 PM 01/20/2021   11:32 AM 12/27/2019    1:53 PM 05/04/2018    3:00 PM 06/02/2017    8:48 AM  Fall Risk   Falls in the past year? 0 0 0 No No  Number falls in past yr: 0 0 0    Injury with Fall? 0 0 0    Risk for fall due to :  No Fall Risks     Follow up Falls evaluation completed;Education provided Falls evaluation completed       [place]:  Any stairs in or around the home? Yes  If so, are there any without handrails? No  Home free of loose throw rugs in walkways, pet beds, electrical cords, etc? Yes  Adequate lighting in your  home to reduce risk of falls? Yes   ASSISTIVE DEVICES UTILIZED TO PREVENT FALLS:  Life alert? No  Use of a cane, walker or w/c? No  Grab bars in the bathroom? Yes  Shower chair or bench in shower? Yes  Elevated toilet seat or a handicapped toilet? No    Cognitive Function:  Normal cognitive status assessed by telephone conversation  by this Nurse Health Advisor. No abnormalities found.      10/05/2018   11:31 AM 06/02/2017    8:48 AM  MMSE - Mini Mental State Exam  Orientation to time 5 5  Orientation to Place 4 5  Registration 3 3  Attention/ Calculation 4 0  Recall 1 2  Recall-comments  unable to recall 1 of 3 words  Language- name 2 objects 2 0  Language- repeat 1 1  Language- follow 3 step command 3 3  Language- read & follow direction 1 0  Write a sentence 1 0  Copy design 1 0  Total score 26 19        Immunizations Immunization History  Administered Date(s) Administered   Fluad Quad(high Dose 65+) 05/08/2020   Influenza Split 05/14/2011   Influenza, High Dose Seasonal PF 04/28/2021   Influenza,inj,Quad PF,6+ Mos 05/11/2016   Influenza,inj,quad, With Preservative 05/18/2017   Influenza-Unspecified 05/09/2013, 04/09/2017, 05/09/2018, 05/02/2019   PFIZER Comirnaty(Gray Top)Covid-19 Tri-Sucrose Vaccine 11/07/2020   PFIZER(Purple Top)SARS-COV-2 Vaccination 08/28/2019, 09/17/2019, 05/05/2020   Pfizer Covid-19 Vaccine Bivalent Booster 87yr & up 04/15/2021, 12/09/2021   Pneumococcal Conjugate-13 11/27/2013   Pneumococcal Polysaccharide-23 05/11/2016   Tdap 05/04/2018   Zoster Recombinat (Shingrix) 03/23/2018, 09/15/2018   Zoster, Live 09/07/2005    TDAP status: Up to date  Flu Vaccine status: Up to date  Pneumococcal vaccine status: Up to date  Covid-19 vaccine status: Completed vaccines  Qualifies for Shingles Vaccine?  Yes   Zostavax completed No   Shingrix Completed?: No.    Education has been provided regarding the importance of this vaccine. Patient  has been advised to call insurance company to determine out of pocket expense if they have not yet received this vaccine. Advised may also receive vaccine at local pharmacy or Health Dept. Verbalized acceptance and understanding.  Screening Tests Health Maintenance  Topic Date Due   INFLUENZA VACCINE  03/09/2022   TETANUS/TDAP  05/04/2028   Pneumonia Vaccine 25+ Years old  Completed   DEXA SCAN  Completed   COVID-19 Vaccine  Completed   Zoster Vaccines- Shingrix  Completed   HPV VACCINES  Aged Out    Health Maintenance  There are no preventive care reminders to display for this patient.  Colorectal cancer screening: No longer required.   Mammogram status: No longer required due to age.  Bone Density status: Completed 06/14/2016. Results reflect: Bone density results: OSTEOPENIA. Repeat every 5 years.  Lung Cancer Screening: (Low Dose CT Chest recommended if Age 20-80 years, 30 pack-year currently smoking OR have quit w/in 15years.) does not qualify.   Lung Cancer Screening Referral: n/a  Additional Screening:  Hepatitis C Screening: does not qualify;   Vision Screening: Recommended annual ophthalmology exams for early detection of glaucoma and other disorders of the eye. Is the patient up to date with their annual eye exam?  Yes  Who is the provider or what is the name of the office in which the patient attends annual eye exams? Dr.Groat  If pt is not established with a provider, would they like to be referred to a provider to establish care? No .   Dental Screening: Recommended annual dental exams for proper oral hygiene  Community Resource Referral / Chronic Care Management: CRR required this visit?  No   CCM required this visit?  No      Plan:     I have personally reviewed and noted the following in the patient's chart:   Medical and social history Use of alcohol, tobacco or illicit drugs  Current medications and supplements including opioid prescriptions.   Functional ability and status Nutritional status Physical activity Advanced directives List of other physicians Hospitalizations, surgeries, and ER visits in previous 12 months Vitals Screenings to include cognitive, depression, and falls Referrals and appointments  In addition, I have reviewed and discussed with patient certain preventive protocols, quality metrics, and best practice recommendations. A written personalized care plan for preventive services as well as general preventive health recommendations were provided to patient.     Randel Pigg, LPN   [id number]   Nurse Notes: none

## 2022-02-24 ENCOUNTER — Other Ambulatory Visit: Payer: Self-pay | Admitting: Internal Medicine

## 2022-03-09 ENCOUNTER — Encounter: Payer: PPO | Admitting: Internal Medicine

## 2022-03-22 ENCOUNTER — Encounter: Payer: PPO | Admitting: Internal Medicine

## 2022-03-23 ENCOUNTER — Encounter: Payer: PPO | Admitting: Internal Medicine

## 2022-03-23 ENCOUNTER — Other Ambulatory Visit: Payer: Self-pay | Admitting: Internal Medicine

## 2022-04-08 ENCOUNTER — Encounter: Payer: Self-pay | Admitting: Internal Medicine

## 2022-04-08 ENCOUNTER — Ambulatory Visit (INDEPENDENT_AMBULATORY_CARE_PROVIDER_SITE_OTHER): Payer: PPO | Admitting: Internal Medicine

## 2022-04-08 VITALS — BP 110/70 | HR 95 | Temp 97.9°F | Ht 62.0 in | Wt 145.0 lb

## 2022-04-08 DIAGNOSIS — E782 Mixed hyperlipidemia: Secondary | ICD-10-CM

## 2022-04-08 DIAGNOSIS — Z789 Other specified health status: Secondary | ICD-10-CM | POA: Diagnosis not present

## 2022-04-08 DIAGNOSIS — R413 Other amnesia: Secondary | ICD-10-CM | POA: Diagnosis not present

## 2022-04-08 DIAGNOSIS — Z0000 Encounter for general adult medical examination without abnormal findings: Secondary | ICD-10-CM | POA: Diagnosis not present

## 2022-04-08 DIAGNOSIS — I10 Essential (primary) hypertension: Secondary | ICD-10-CM

## 2022-04-08 LAB — LIPID PANEL
Cholesterol: 190 mg/dL (ref 0–200)
HDL: 80.9 mg/dL (ref >39.00)
LDL Cholesterol: 91 mg/dL (ref 0–99)
NonHDL: 109.12
Total CHOL/HDL Ratio: 2
Triglycerides: 91 mg/dL (ref 0.0–149.0)
VLDL: 18.2 mg/dL (ref 0.0–40.0)

## 2022-04-08 LAB — COMPREHENSIVE METABOLIC PANEL
ALT: 13 U/L (ref 0–35)
AST: 18 U/L (ref 0–37)
Albumin: 4.4 g/dL (ref 3.5–5.2)
Alkaline Phosphatase: 73 U/L (ref 39–117)
BUN: 14 mg/dL (ref 6–23)
CO2: 30 mEq/L (ref 19–32)
Calcium: 10.3 mg/dL (ref 8.4–10.5)
Chloride: 94 mEq/L — ABNORMAL LOW (ref 96–112)
Creatinine, Ser: 0.94 mg/dL (ref 0.40–1.20)
GFR: 56.77 mL/min — ABNORMAL LOW (ref >60.00)
Glucose, Bld: 101 mg/dL — ABNORMAL HIGH (ref 70–99)
Potassium: 4.1 mEq/L (ref 3.5–5.1)
Sodium: 135 mEq/L (ref 135–145)
Total Bilirubin: 1 mg/dL (ref 0.2–1.2)
Total Protein: 7.8 g/dL (ref 6.0–8.3)

## 2022-04-08 LAB — CBC
HCT: 41.3 % (ref 36.0–46.0)
Hemoglobin: 14.2 g/dL (ref 12.0–15.0)
MCHC: 34.3 g/dL (ref 30.0–36.0)
MCV: 94.7 fl (ref 78.0–100.0)
Platelets: 384 10*3/uL (ref 150.0–400.0)
RBC: 4.36 Mil/uL (ref 3.87–5.11)
RDW: 14.6 % (ref 11.5–15.5)
WBC: 9.4 10*3/uL (ref 4.0–10.5)

## 2022-04-08 MED ORDER — AMLODIPINE BESYLATE 5 MG PO TABS: 5.0000 mg | ORAL_TABLET | Freq: Every day | ORAL | 3 refills | Status: DC

## 2022-04-08 MED ORDER — HYDROCHLOROTHIAZIDE 25 MG PO TABS: 25.0000 mg | ORAL_TABLET | Freq: Every day | ORAL | 3 refills | Status: DC

## 2022-04-08 MED ORDER — LISINOPRIL 10 MG PO TABS: 10.0000 mg | ORAL_TABLET | Freq: Every day | ORAL | 3 refills | Status: DC

## 2022-04-08 MED ORDER — ATORVASTATIN CALCIUM 20 MG PO TABS: 20.0000 mg | ORAL_TABLET | Freq: Every day | ORAL | 3 refills | Status: DC

## 2022-04-08 MED ORDER — PANTOPRAZOLE SODIUM 40 MG PO TBEC
40.0000 mg | DELAYED_RELEASE_TABLET | Freq: Every day | ORAL | 3 refills | Status: AC
Start: 2022-04-08 — End: ?

## 2022-04-08 NOTE — Progress Notes (Signed)
   Subjective:   Patient ID: Anna Hendricks, female    DOB: Dec 08, 1940, 81 y.o.   MRN: [id number]  HPI The patient is here for physical.  PMH, [facility], social history reviewed and updated  Review of Systems  Constitutional: Negative.   HENT: Negative.    Eyes: Negative.   Respiratory:  Negative for cough, chest tightness and shortness of breath.   Cardiovascular:  Negative for chest pain, palpitations and leg swelling.  Gastrointestinal:  Negative for abdominal distention, abdominal pain, constipation, diarrhea, nausea and vomiting.  Musculoskeletal: Negative.   Skin: Negative.   Neurological: Negative.   Psychiatric/Behavioral: Negative.      Objective:  Physical Exam Constitutional:      Appearance: She is well-developed.  HENT:     Head: Normocephalic and atraumatic.  Cardiovascular:     Rate and Rhythm: Normal rate and regular rhythm.  Pulmonary:     Effort: Pulmonary effort is normal. No respiratory distress.     Breath sounds: Normal breath sounds. No wheezing or rales.  Abdominal:     General: Bowel sounds are normal. There is no distension.     Palpations: Abdomen is soft.     Tenderness: There is no abdominal tenderness. There is no rebound.  Musculoskeletal:     Cervical back: Normal range of motion.  Skin:    General: Skin is warm and dry.  Neurological:     Mental Status: She is alert and oriented to person, place, and time.     Coordination: Coordination normal.     Vitals:   04/08/22 1034  BP: 110/70  Pulse: 95  Temp: 97.9 F (36.6 C)  TempSrc: Oral  SpO2: 96%  Weight: 145 lb (65.8 kg)  Height: '5\' 2"'$  (1.575 m)    Assessment & Plan:

## 2022-04-09 NOTE — Assessment & Plan Note (Signed)
Flu shot yearly. Covid-19 counseled. Pneumonia complete. Shingrix complete. Tetanus up to date. Colonoscopy aged out. Mammogram aged out, pap smear aged out and dexa complete. Counseled about sun safety and mole surveillance. Counseled about the dangers of distracted driving. Given 10 year screening recommendations.

## 2022-04-09 NOTE — Assessment & Plan Note (Signed)
Checking lipid panel and adjust lipitor 20 mg daily as needed. 

## 2022-04-09 NOTE — Assessment & Plan Note (Signed)
They deny significant change to memory and do not want to discuss memory in detail.

## 2022-04-09 NOTE — Assessment & Plan Note (Addendum)
BP at goal on amlodipine 5 mg daily and lisinopril 10 mg daily and hctz 25 mg daily. Checking CMP and adjust as needed.

## 2022-04-09 NOTE — Assessment & Plan Note (Signed)
Admits to stable usage and not interested in discussing. Suspect this contributes to memory problems.

## 2022-09-28 ENCOUNTER — Telehealth: Payer: PPO

## 2022-12-21 ENCOUNTER — Ambulatory Visit (INDEPENDENT_AMBULATORY_CARE_PROVIDER_SITE_OTHER): Payer: PPO

## 2022-12-21 ENCOUNTER — Telehealth: Payer: Self-pay

## 2022-12-21 VITALS — Ht 62.0 in | Wt 138.0 lb

## 2022-12-21 DIAGNOSIS — Z0000 Encounter for general adult medical examination without abnormal findings: Secondary | ICD-10-CM | POA: Diagnosis not present

## 2022-12-21 NOTE — Telephone Encounter (Signed)
Contacted Anna Hendricks to schedule their annual wellness visit. Appointment made for 12/21/22.  Anna Hendricks, CMA (AAMA)  [facility] [phone]

## 2022-12-21 NOTE — Progress Notes (Signed)
Subjective:   Anna Hendricks is a 82 y.o. female who presents for Medicare Annual (Subsequent) preventive examination.  Review of Systems     Cardiac Risk Factors include: advanced age (>69men, >37 women);dyslipidemia;hypertension;sedentary lifestyle;family history of premature cardiovascular disease     Objective:    Today's Vitals   12/21/22 1504  Weight: 138 lb (62.6 kg)  Height: 5\' 2"  (1.575 m)  PainSc: 0-No pain   Body mass index is 25.24 kg/m.     12/21/2022    3:06 PM 02/19/2022    2:18 PM 01/20/2021   11:31 AM 06/02/2017    8:50 AM  Advanced Directives  Does Patient Have a Medical Advance Directive? No Yes No No  Type of Special educational needs teacher of [place];Living will    Copy of Healthcare Power of Attorney in Chart?  No - copy requested    Would patient like information on creating a medical advance directive? No - Patient declined  No - Patient declined No - Patient declined    Current Medications (verified) Outpatient Encounter Medications as of 12/21/2022  Medication Sig   amLODipine (NORVASC) 5 MG tablet Take 1 tablet (5 mg total) by mouth daily.   atorvastatin (LIPITOR) 20 MG tablet Take 1 tablet (20 mg total) by mouth daily.   Calcium Carbonate (CALCIUM 600 PO) Take by mouth.   Cholecalciferol (VITAMIN D PO) Take 1,000 Units by mouth daily.   hydrochlorothiazide (HYDRODIURIL) 25 MG tablet Take 1 tablet (25 mg total) by mouth daily.   lisinopril (ZESTRIL) 10 MG tablet Take 1 tablet (10 mg total) by mouth daily.   Multiple Vitamin (MULTIVITAMIN) tablet Take 1 tablet by mouth daily.   pantoprazole (PROTONIX) 40 MG tablet Take 1 tablet (40 mg total) by mouth daily.   No facility-administered encounter medications on file as of 12/21/2022.    Allergies (verified) Patient has no known allergies.   History: Past Medical History:  Diagnosis Date   Arthritis    GERD (gastroesophageal reflux disease)    History of chicken pox    Hyperlipidemia     Hypertension    Past Surgical History:  Procedure Laterality Date   APPENDECTOMY  1955   COLONOSCOPY     DILATION AND CURETTAGE OF UTERUS     ELBOW SURGERY  2007   Family History  Problem Relation Age of Onset   Arthritis Mother    Hyperlipidemia Mother    Stroke Mother    Hypertension Father    Cancer Maternal Aunt        breast CA   Social History   Socioeconomic History   Marital status: Married    Spouse name: Not on file   Number of children: Not on file   Years of education: Not on file   Highest education level: Not on file  Occupational History   Not on file  Tobacco Use   Smoking status: Former    Types: Cigarettes    Quit date: 08/10/1987    Years since quitting: 35.3   Smokeless tobacco: Never  Substance and Sexual Activity   Alcohol use: Yes    Alcohol/week: 4.0 standard drinks of alcohol    Types: 4 Glasses of wine per week    Comment: rare   Drug use: No   Sexual activity: Not on file  Other Topics Concern   Not on file  Social History Narrative   Married.  Lives with husband Anna Hendricks.     Social Determinants of Health  Financial Resource Strain: Low Risk  (12/21/2022)   Overall Financial Resource Strain (CARDIA)    Difficulty of Paying Living Expenses: Not hard at all  Food Insecurity: No Food Insecurity (12/21/2022)   Hunger Vital Sign    Worried About Running Out of Food in the Last Year: Never true    Ran Out of Food in the Last Year: Never true  Transportation Needs: No Transportation Needs (12/21/2022)   PRAPARE - Administrator, Civil Service (Medical): No    Lack of Transportation (Non-Medical): No  Physical Activity: Inactive (12/21/2022)   Exercise Vital Sign    Days of Exercise per Week: 0 days    Minutes of Exercise per Session: 0 min  Stress: No Stress Concern Present (12/21/2022)   [employer] of Occupational Health - Occupational Stress Questionnaire    Feeling of Stress : Not at all  Social Connections:  Socially Integrated (12/21/2022)   Social Connection and Isolation Panel [NHANES]    Frequency of Communication with Friends and Family: Three times a week    Frequency of Social Gatherings with Friends and Family: Three times a week    Attends Religious Services: More than 4 times per year    Active Member of Clubs or Organizations: Yes    Attends Banker Meetings: 1 to 4 times per year    Marital Status: Married    Tobacco Counseling Counseling given: Not Answered   Clinical Intake:  Pre-visit preparation completed: Yes  Pain : No/denies pain Pain Score: 0-No pain     BMI - recorded: 25.24 Nutritional Status: BMI 25 -29 Overweight Nutritional Risks: None Diabetes: No  How often do you need to have someone help you when you read instructions, pamphlets, or other written materials from your doctor or pharmacy?: 1 - Never What is the last grade level you completed in school?: HSG  Diabetic? NO  Interpreter Needed?: No  Information entered by :: Susie Cassette, LPN.   Activities of Daily Living    12/21/2022    3:09 PM 02/19/2022    2:22 PM  In your present state of health, do you have any difficulty performing the following activities:  Hearing? 0 0  Vision? 0 0  Difficulty concentrating or making decisions? 0 0  Comment BSE: reading, suduko, puzzles   Walking or climbing stairs? 0 0  Dressing or bathing? 0 0  Doing errands, shopping? 0 0  Preparing Food and eating ? N N  Using the Toilet? N N  In the past six months, have you accidently leaked urine? N N  Do you have problems with loss of bowel control? N N  Managing your Medications? N N  Managing your Finances? N N  Housekeeping or managing your Housekeeping? N N    Patient Care Team: Myrlene Broker, MD as PCP - General (Internal Medicine) Dione Booze, Bertram Millard, MD as Consulting Physician (Ophthalmology)  Indicate any recent Medical Services you may have received from other than [facility]  providers in the past year (date may be approximate).     Assessment:   This is a routine wellness examination for Anna Hendricks.  Hearing/Vision screen Hearing Screening - Comments:: Denies hearing difficulties.   Vision Screening - Comments:: Wears reading glasses - up to date with routine eye exams with R. Fabian Sharp, MD.    Dietary issues and exercise activities discussed: Current Exercise Habits: The patient does not participate in regular exercise at present, Exercise limited by: None identified  Goals Addressed             This Visit's Progress    Manage My Medicine       Timeframe:  Long-Range Goal Priority:  Medium Start Date:     08/28/20                        Expected End Date:     09/29/2023                   - call for medicine refill 2 or 3 days before it runs out - keep a list of all the medicines I take; vitamins and herbals too - learn to read medicine labels    Why is this important?   These steps will help you keep on track with your medicines.     Prevent Falls and Broken Bones-Osteoporosis       Timeframe:  Long-Range Goal Priority:  High Start Date:  08/28/20                           Expected End Date:         02/26/24               - always use handrails on the stairs - always wear shoes or slippers with non-slip sole - keep a flashlight by the bed - make an emergency alert plan in case I fall - pick up clutter from the floors - use a nightlight in the bathroom - wear low heeled or flat shoes with non-skid soles    Why is this important?   When you fall, there are 3 things that control if a bone breaks or not.  These are the fall itself, how hard and the direction that you fall and how fragile your bones are.  Preventing falls is very important for you because of fragile bones.        Depression Screen    12/21/2022    3:09 PM 04/08/2022   10:38 AM 02/19/2022    2:22 PM 02/19/2022    2:21 PM 01/20/2021   11:30 AM 12/27/2019    1:52 PM  05/04/2018    3:00 PM  PHQ 2/9 Scores  PHQ - 2 Score 0 0 0 0 0 0 0  PHQ- 9 Score 0          Fall Risk    12/21/2022    3:08 PM 04/08/2022   10:38 AM 02/19/2022    2:21 PM 01/20/2021   11:32 AM 12/27/2019    1:53 PM  Fall Risk   Falls in the past year? 0 0 0 0 0  Number falls in past yr: 0 0 0 0 0  Injury with Fall? 0 0 0 0 0  Risk for fall due to : No Fall Risks No Fall Risks  No Fall Risks   Follow up Falls prevention discussed Falls evaluation completed Falls evaluation completed;Education provided Falls evaluation completed     FALL RISK PREVENTION PERTAINING TO THE HOME:  Any stairs in or around the home? No  If so, are there any without handrails? No  Home free of loose throw rugs in walkways, pet beds, electrical cords, etc? Yes  Adequate lighting in your home to reduce risk of falls? Yes   ASSISTIVE DEVICES UTILIZED TO PREVENT FALLS:  Life alert? No  Use of a cane, walker or w/c? No  Grab  bars in the bathroom? Yes  Shower chair or bench in shower? Yes  Elevated toilet seat or a handicapped toilet? No   TIMED UP AND GO:  Was the test performed? No . Telephonic Visit  Cognitive Function:    10/05/2018   11:31 AM 06/02/2017    8:48 AM  MMSE - Mini Mental State Exam  Orientation to time 5 5  Orientation to Place 4 5  Registration 3 3  Attention/ Calculation 4 0  Recall 1 2  Recall-comments  unable to recall 1 of 3 words  Language- name 2 objects 2 0  Language- repeat 1 1  Language- follow 3 step command 3 3  Language- read & follow direction 1 0  Write a sentence 1 0  Copy design 1 0  Total score 26 19        12/21/2022    3:17 PM  6CIT Screen  What Year? 0 points  What month? 0 points  What time? 0 points  Count back from 20 0 points  Months in reverse 0 points  Repeat phrase 0 points  Total Score 0 points    Immunizations Immunization History  Administered Date(s) Administered   Fluad Quad(high Dose 65+) 05/08/2020   Influenza Split  05/14/2011   Influenza, High Dose Seasonal PF 04/28/2021   Influenza,inj,Quad PF,6+ Mos 05/11/2016   Influenza,inj,quad, With Preservative 05/18/2017   Influenza-Unspecified 05/09/2013, 04/09/2017, 05/09/2018, 05/02/2019, 06/13/2022   PFIZER Comirnaty(Gray Top)Covid-19 Tri-Sucrose Vaccine 11/07/2020, 05/12/2022   PFIZER(Purple Top)SARS-COV-2 Vaccination 08/28/2019, 09/17/2019, 05/05/2020   Pfizer Covid-19 Vaccine Bivalent Booster 50yrs & up 04/15/2021, 12/09/2021   Pneumococcal Conjugate-13 11/27/2013   Pneumococcal Polysaccharide-23 05/11/2016   Respiratory Syncytial Virus Vaccine,Recomb Aduvanted(Arexvy) 07/02/2022   Tdap 05/04/2018   Zoster Recombinat (Shingrix) 03/23/2018, 09/15/2018   Zoster, Live 09/07/2005    TDAP status: Up to date  Flu Vaccine status: Up to date  Pneumococcal vaccine status: Up to date  Covid-19 vaccine status: Completed vaccines  Qualifies for Shingles Vaccine? Yes   Zostavax completed Yes   Shingrix Completed?: Yes  Screening Tests Health Maintenance  Topic Date Due   COVID-19 Vaccine (8 - 2023-24 season) 07/07/2022   INFLUENZA VACCINE  03/10/2023   Medicare Annual Wellness (AWV)  12/21/2023   DTaP/Tdap/Td (2 - Td or Tdap) 05/04/2028   Pneumonia Vaccine 37+ Years old  Completed   DEXA SCAN  Completed   Zoster Vaccines- Shingrix  Completed   HPV VACCINES  Aged Out    Health Maintenance  Health Maintenance Due  Topic Date Due   COVID-19 Vaccine (8 - 2023-24 season) 07/07/2022    Colorectal cancer screening: No longer required. AGED OUT  Mammogram status: No longer required due to AGED OUT.  Bone Density status: Completed 06/14/2016. Results reflect: Bone density results: OSTEOPENIA. Repeat every 2 years.  Lung Cancer Screening: (Low Dose CT Chest recommended if Age 67-80 years, 30 pack-year currently smoking OR have quit w/in 15years.) does not qualify.   Lung Cancer Screening Referral: NO  Additional Screening:  Hepatitis C  Screening: does not qualify; Completed: NO  Vision Screening: Recommended annual ophthalmology exams for early detection of glaucoma and other disorders of the eye. Is the patient up to date with their annual eye exam?  Yes  Who is the provider or what is the name of the office in which the patient attends annual eye exams? Richard Fabian Sharp, MD. If pt is not established with a provider, would they like to be referred to a provider to  establish care? No .   Dental Screening: Recommended annual dental exams for proper oral hygiene  Community Resource Referral / Chronic Care Management: CRR required this visit?  No   CCM required this visit?  No      Plan:     I have personally reviewed and noted the following in the patient's chart:   Medical and social history Use of alcohol, tobacco or illicit drugs  Current medications and supplements including opioid prescriptions. Patient is not currently taking opioid prescriptions. Functional ability and status Nutritional status Physical activity Advanced directives List of other physicians Hospitalizations, surgeries, and ER visits in previous 12 months Vitals Screenings to include cognitive, depression, and falls Referrals and appointments  In addition, I have reviewed and discussed with patient certain preventive protocols, quality metrics, and best practice recommendations. A written personalized care plan for preventive services as well as general preventive health recommendations were provided to patient.     Mickeal Needy, LPN   1/61/0960   Nurse Notes:  Normal cognitive status assessed by direct observation via telephone conversation by this Nurse Health Advisor. No abnormalities found.

## 2022-12-21 NOTE — Patient Instructions (Addendum)
Anna Hendricks , Thank you for taking time to come for your Medicare Wellness Visit. I appreciate your ongoing commitment to your health goals. Please review the following plan we discussed and let me know if I can assist you in the future.   These are the goals we discussed:  Goals      Manage My Medicine     Timeframe:  Long-Range Goal Priority:  Medium Start Date:     08/28/20                        Expected End Date:     09/29/2023                   - call for medicine refill 2 or 3 days before it runs out - keep a list of all the medicines I take; vitamins and herbals too - learn to read medicine labels    Why is this important?   These steps will help you keep on track with your medicines.     Prevent Falls and Broken Bones-Osteoporosis     Timeframe:  Long-Range Goal Priority:  High Start Date:  08/28/20                           Expected End Date:         02/26/24               - always use handrails on the stairs - always wear shoes or slippers with non-slip sole - keep a flashlight by the bed - make an emergency alert plan in case I fall - pick up clutter from the floors - use a nightlight in the bathroom - wear low heeled or flat shoes with non-skid soles    Why is this important?   When you fall, there are 3 things that control if a bone breaks or not.  These are the fall itself, how hard and the direction that you fall and how fragile your bones are.  Preventing falls is very important for you because of fragile bones.          This is a list of the screening recommended for you and due dates:  Health Maintenance  Topic Date Due   COVID-19 Vaccine (8 - 2023-24 season) 07/07/2022   Flu Shot  03/10/2023   Medicare Annual Wellness Visit  12/21/2023   DTaP/Tdap/Td vaccine (2 - Td or Tdap) 05/04/2028   Pneumonia Vaccine  Completed   DEXA scan (bone density measurement)  Completed   Zoster (Shingles) Vaccine  Completed   HPV Vaccine  Aged Out    Advanced  directives: NO  Conditions/risks identified: YES  Next appointment: Follow up in one year for your annual wellness visit.   Preventive Care 36 Years and Older, Female Preventive care refers to lifestyle choices and visits with your health care provider that can promote health and wellness. What does preventive care include? A yearly physical exam. This is also called an annual well check. Dental exams once or twice a year. Routine eye exams. Ask your health care provider how often you should have your eyes checked. Personal lifestyle choices, including: Daily care of your teeth and gums. Regular physical activity. Eating a healthy diet. Avoiding tobacco and drug use. Limiting alcohol use. Practicing safe sex. Taking low-dose aspirin every day. Taking vitamin and mineral supplements as recommended by your health care provider. What  happens during an annual well check? The services and screenings done by your health care provider during your annual well check will depend on your age, overall health, lifestyle risk factors, and family history of disease. Counseling  Your health care provider may ask you questions about your: Alcohol use. Tobacco use. Drug use. Emotional well-being. Home and relationship well-being. Sexual activity. Eating habits. History of falls. Memory and ability to understand (cognition). Work and work Astronomer. Reproductive health. Screening  You may have the following tests or measurements: Height, weight, and BMI. Blood pressure. Lipid and cholesterol levels. These may be checked every 5 years, or more frequently if you are over 70 years old. Skin check. Lung cancer screening. You may have this screening every year starting at age 53 if you have a 30-pack-year history of smoking and currently smoke or have quit within the past 15 years. Fecal occult blood test (FOBT) of the stool. You may have this test every year starting at age 44. Flexible  sigmoidoscopy or colonoscopy. You may have a sigmoidoscopy every 5 years or a colonoscopy every 10 years starting at age 81. Hepatitis C blood test. Hepatitis B blood test. Sexually transmitted disease (STD) testing. Diabetes screening. This is done by checking your blood sugar (glucose) after you have not eaten for a while (fasting). You may have this done every 1-3 years. Bone density scan. This is done to screen for osteoporosis. You may have this done starting at age 58. Mammogram. This may be done every 1-2 years. Talk to your health care provider about how often you should have regular mammograms. Talk with your health care provider about your test results, treatment options, and if necessary, the need for more tests. Vaccines  Your health care provider may recommend certain vaccines, such as: Influenza vaccine. This is recommended every year. Tetanus, diphtheria, and acellular pertussis (Tdap, Td) vaccine. You may need a Td booster every 10 years. Zoster vaccine. You may need this after age 5. Pneumococcal 13-valent conjugate (PCV13) vaccine. One dose is recommended after age 40. Pneumococcal polysaccharide (PPSV23) vaccine. One dose is recommended after age 77. Talk to your health care provider about which screenings and vaccines you need and how often you need them. This information is not intended to replace advice given to you by your health care provider. Make sure you discuss any questions you have with your health care provider. Document Released: 08/22/2015 Document Revised: 04/14/2016 Document Reviewed: 05/27/2015 Elsevier Interactive Patient Education  2017 [employer].  Fall Prevention in the Home Falls can cause injuries. They can happen to people of all ages. There are many things you can do to make your home safe and to help prevent falls. What can I do on the outside of my home? Regularly fix the edges of walkways and driveways and fix any cracks. Remove anything that  might make you trip as you walk through a door, such as a raised step or threshold. Trim any bushes or trees on the path to your home. Use bright outdoor lighting. Clear any walking paths of anything that might make someone trip, such as rocks or tools. Regularly check to see if handrails are loose or broken. Make sure that both sides of any steps have handrails. Any raised decks and porches should have guardrails on the edges. Have any leaves, snow, or ice cleared regularly. Use sand or salt on walking paths during winter. Clean up any spills in your garage right away. This includes oil or grease spills.  What can I do in the bathroom? Use night lights. Install grab bars by the toilet and in the tub and shower. Do not use towel bars as grab bars. Use non-skid mats or decals in the tub or shower. If you need to sit down in the shower, use a plastic, non-slip stool. Keep the floor dry. Clean up any water that spills on the floor as soon as it happens. Remove soap buildup in the tub or shower regularly. Attach bath mats securely with double-sided non-slip rug tape. Do not have throw rugs and other things on the floor that can make you trip. What can I do in the bedroom? Use night lights. Make sure that you have a light by your bed that is easy to reach. Do not use any sheets or blankets that are too big for your bed. They should not hang down onto the floor. Have a firm chair that has side arms. You can use this for support while you get dressed. Do not have throw rugs and other things on the floor that can make you trip. What can I do in the kitchen? Clean up any spills right away. Avoid walking on wet floors. Keep items that you use a lot in easy-to-reach places. If you need to reach something above you, use a strong step stool that has a grab bar. Keep electrical cords out of the way. Do not use floor polish or wax that makes floors slippery. If you must use wax, use non-skid floor  wax. Do not have throw rugs and other things on the floor that can make you trip. What can I do with my stairs? Do not leave any items on the stairs. Make sure that there are handrails on both sides of the stairs and use them. Fix handrails that are broken or loose. Make sure that handrails are as long as the stairways. Check any carpeting to make sure that it is firmly attached to the stairs. Fix any carpet that is loose or worn. Avoid having throw rugs at the top or bottom of the stairs. If you do have throw rugs, attach them to the floor with carpet tape. Make sure that you have a light switch at the top of the stairs and the bottom of the stairs. If you do not have them, ask someone to add them for you. What else can I do to help prevent falls? Wear shoes that: Do not have high heels. Have rubber bottoms. Are comfortable and fit you well. Are closed at the toe. Do not wear sandals. If you use a stepladder: Make sure that it is fully opened. Do not climb a closed stepladder. Make sure that both sides of the stepladder are locked into place. Ask someone to hold it for you, if possible. Clearly mark and make sure that you can see: Any grab bars or handrails. First and last steps. Where the edge of each step is. Use tools that help you move around (mobility aids) if they are needed. These include: Canes. Walkers. Scooters. Crutches. Turn on the lights when you go into a dark area. Replace any light bulbs as soon as they burn out. Set up your furniture so you have a clear path. Avoid moving your furniture around. If any of your floors are uneven, fix them. If there are any pets around you, be aware of where they are. Review your medicines with your doctor. Some medicines can make you feel dizzy. This can increase your chance of  falling. Ask your doctor what other things that you can do to help prevent falls. This information is not intended to replace advice given to you by your  health care provider. Make sure you discuss any questions you have with your health care provider. Document Released: 05/22/2009 Document Revised: 01/01/2016 Document Reviewed: 08/30/2014 Elsevier Interactive Patient Education  2017 [employer].

## 2023-02-28 ENCOUNTER — Telehealth: Payer: Self-pay | Admitting: *Deleted

## 2023-02-28 NOTE — Telephone Encounter (Signed)
I connected with Anna Hendricks on 7/22 at 1236 by telephone and verified that I am speaking with the correct person using two identifiers. According to the patient's chart they are due for physical  with [facility]. Pt scheduled. There are no transportation issues at this time. Nothing further was needed at the end of our conversation.

## 2023-04-12 ENCOUNTER — Encounter: Payer: PPO | Admitting: Internal Medicine

## 2023-04-14 ENCOUNTER — Encounter: Payer: Self-pay | Admitting: Internal Medicine

## 2023-04-23 ENCOUNTER — Other Ambulatory Visit: Payer: Self-pay | Admitting: Internal Medicine

## 2023-05-10 ENCOUNTER — Ambulatory Visit (INDEPENDENT_AMBULATORY_CARE_PROVIDER_SITE_OTHER): Payer: PPO | Admitting: Internal Medicine

## 2023-05-10 ENCOUNTER — Encounter: Payer: Self-pay | Admitting: Internal Medicine

## 2023-05-10 VITALS — BP 138/84 | HR 60 | Temp 97.9°F | Ht 62.0 in | Wt 148.0 lb

## 2023-05-10 DIAGNOSIS — K219 Gastro-esophageal reflux disease without esophagitis: Secondary | ICD-10-CM

## 2023-05-10 DIAGNOSIS — Z0000 Encounter for general adult medical examination without abnormal findings: Secondary | ICD-10-CM | POA: Diagnosis not present

## 2023-05-10 DIAGNOSIS — R739 Hyperglycemia, unspecified: Secondary | ICD-10-CM | POA: Diagnosis not present

## 2023-05-10 DIAGNOSIS — E782 Mixed hyperlipidemia: Secondary | ICD-10-CM

## 2023-05-10 DIAGNOSIS — I10 Essential (primary) hypertension: Secondary | ICD-10-CM | POA: Diagnosis not present

## 2023-05-10 LAB — CBC
HCT: 43.4 % (ref 36.0–46.0)
Hemoglobin: 14.6 g/dL (ref 12.0–15.0)
MCHC: 33.6 g/dL (ref 30.0–36.0)
MCV: 92.6 fL (ref 78.0–100.0)
Platelets: 474 10*3/uL — ABNORMAL HIGH (ref 150.0–400.0)
RBC: 4.68 Mil/uL (ref 3.87–5.11)
RDW: 14.7 % (ref 11.5–15.5)
WBC: 7.5 10*3/uL (ref 4.0–10.5)

## 2023-05-10 LAB — COMPREHENSIVE METABOLIC PANEL
ALT: 13 U/L (ref 0–35)
AST: 17 U/L (ref 0–37)
Albumin: 4.2 g/dL (ref 3.5–5.2)
Alkaline Phosphatase: 81 U/L (ref 39–117)
BUN: 12 mg/dL (ref 6–23)
CO2: 30 meq/L (ref 19–32)
Calcium: 10.4 mg/dL (ref 8.4–10.5)
Chloride: 99 meq/L (ref 96–112)
Creatinine, Ser: 0.88 mg/dL (ref 0.40–1.20)
GFR: 60.98 mL/min (ref >60.00)
Glucose, Bld: 88 mg/dL (ref 70–99)
Potassium: 3.3 meq/L — ABNORMAL LOW (ref 3.5–5.1)
Sodium: 140 meq/L (ref 135–145)
Total Bilirubin: 0.8 mg/dL (ref 0.2–1.2)
Total Protein: 7.5 g/dL (ref 6.0–8.3)

## 2023-05-10 LAB — LIPID PANEL
Cholesterol: 217 mg/dL — ABNORMAL HIGH (ref 0–200)
HDL: 75.1 mg/dL (ref >39.00)
LDL Cholesterol: 126 mg/dL — ABNORMAL HIGH (ref 0–99)
NonHDL: 141.51
Total CHOL/HDL Ratio: 3
Triglycerides: 77 mg/dL (ref 0.0–149.0)
VLDL: 15.4 mg/dL (ref 0.0–40.0)

## 2023-05-10 LAB — HEMOGLOBIN A1C: Hgb A1c MFr Bld: 5.6 % (ref 4.6–6.5)

## 2023-05-10 MED ORDER — AMLODIPINE BESYLATE 5 MG PO TABS: 5.0000 mg | ORAL_TABLET | Freq: Every day | ORAL | 3 refills | Status: DC

## 2023-05-10 MED ORDER — LISINOPRIL 10 MG PO TABS: 10.0000 mg | ORAL_TABLET | Freq: Every day | ORAL | 3 refills | Status: DC

## 2023-05-10 MED ORDER — ATORVASTATIN CALCIUM 20 MG PO TABS: 20.0000 mg | ORAL_TABLET | Freq: Every day | ORAL | 3 refills | Status: DC

## 2023-05-10 MED ORDER — PANTOPRAZOLE SODIUM 40 MG PO TBEC: 40.0000 mg | DELAYED_RELEASE_TABLET | Freq: Every day | ORAL | 3 refills | Status: DC

## 2023-05-10 MED ORDER — HYDROCHLOROTHIAZIDE 25 MG PO TABS: 25.0000 mg | ORAL_TABLET | Freq: Every day | ORAL | 3 refills | Status: DC

## 2023-05-10 NOTE — Progress Notes (Signed)
   Subjective:   Patient ID: Anna Hendricks, female    DOB: 1940-10-15, 82 y.o.   MRN: [id number]  HPI The patient is here for physical.  PMH, [facility], social history reviewed and updated  Review of Systems  Constitutional: Negative.   HENT: Negative.    Eyes: Negative.   Respiratory:  Negative for cough, chest tightness and shortness of breath.   Cardiovascular:  Negative for chest pain, palpitations and leg swelling.  Gastrointestinal:  Negative for abdominal distention, abdominal pain, constipation, diarrhea, nausea and vomiting.  Musculoskeletal: Negative.   Skin: Negative.   Neurological: Negative.   Psychiatric/Behavioral: Negative.      Objective:  Physical Exam Constitutional:      Appearance: She is well-developed.  HENT:     Head: Normocephalic and atraumatic.  Cardiovascular:     Rate and Rhythm: Normal rate and regular rhythm.  Pulmonary:     Effort: Pulmonary effort is normal. No respiratory distress.     Breath sounds: Normal breath sounds. No wheezing or rales.  Abdominal:     General: Bowel sounds are normal. There is no distension.     Palpations: Abdomen is soft.     Tenderness: There is no abdominal tenderness. There is no rebound.  Musculoskeletal:     Cervical back: Normal range of motion.  Skin:    General: Skin is warm and dry.  Neurological:     Mental Status: She is alert and oriented to person, place, and time.     Coordination: Coordination normal.     Vitals:   05/10/23 1003  BP: 138/84  Pulse: 60  Temp: 97.9 F (36.6 C)  TempSrc: Oral  SpO2: 96%  Weight: 148 lb (67.1 kg)  Height: 5\' 2"  (1.575 m)    Assessment & Plan:

## 2023-05-13 DIAGNOSIS — R739 Hyperglycemia, unspecified: Secondary | ICD-10-CM | POA: Insufficient documentation

## 2023-05-13 DIAGNOSIS — K219 Gastro-esophageal reflux disease without esophagitis: Secondary | ICD-10-CM | POA: Insufficient documentation

## 2023-05-13 NOTE — Assessment & Plan Note (Signed)
BP at goal on lisinopril 10 mg daily and hydrochlorothiazide 25 mg daily and amlodipine 5 mg daily. Checking CMP and adjust as needed.

## 2023-05-13 NOTE — Assessment & Plan Note (Signed)
Flu shot up to date. Pneumonia complete. Shingrix complete. Tetanus up to date. Colonoscopy aged out. Mammogram aged out, pap smear aged out and dexa complete. Counseled about sun safety and mole surveillance. Counseled about the dangers of distracted driving. Given 10 year screening recommendations.

## 2023-05-13 NOTE — Assessment & Plan Note (Signed)
Taking protonix 40 mg daily and dysphagia resolved. Will continue lifelong.

## 2023-05-13 NOTE — Assessment & Plan Note (Signed)
High reading will check Hga1c.

## 2023-05-13 NOTE — Assessment & Plan Note (Signed)
Checking lipid panel and adjust lipitor 20 mg daily as needed. 

## 2023-12-22 NOTE — Progress Notes (Signed)
 This encounter was created in error - please disregard.  Called X3 with no answer/LDM to call office to reschedule visit.  I also called yesterday afternoon to remind pt/ no answer/LDM yesterday also.

## 2024-04-11 ENCOUNTER — Encounter: Payer: Self-pay | Admitting: Emergency Medicine

## 2024-04-11 ENCOUNTER — Ambulatory Visit: Admitting: Emergency Medicine

## 2024-04-16 ENCOUNTER — Ambulatory Visit: Payer: Self-pay

## 2024-04-16 NOTE — Telephone Encounter (Signed)
 Call disconnected during transfer, called patient number back, no answer, lvm   Copied from CRM #[id number]. Topic: Clinical - Red Word Triage >> Apr 16, 2024  8:11 AM Avram MATSU wrote: Red Word that prompted transfer to Nurse Triage: having trouble walking, pain legs and feet.    ----------------------------------------------------------------------- From previous Reason for Contact - Scheduling: Patient/patient representative is calling to schedule an appointment. Refer to attachments for appointment information.

## 2024-04-16 NOTE — Telephone Encounter (Signed)
 This is an FYI for you. Patient has an appointment now

## 2024-04-16 NOTE — Telephone Encounter (Addendum)
 Husband answered phone, states already has an appointment.  No triage.   Second attempt; no answer     Call disconnected during transfer, called patient number back, no answer, lvm   Copied from CRM #[id number]. Topic: Clinical - Red Word Triage >> Apr 16, 2024  8:11 AM Anna Hendricks wrote: Red Word that prompted transfer to Nurse Triage: having trouble walking, pain legs and feet.    ----------------------------------------------------------------------- From previous Reason for Contact - Scheduling: Patient/patient representative is calling to schedule an appointment. Refer to attachments for appointment information.

## 2024-04-23 ENCOUNTER — Ambulatory Visit: Admitting: Internal Medicine

## 2024-04-23 ENCOUNTER — Encounter: Payer: Self-pay | Admitting: Internal Medicine

## 2024-04-23 VITALS — BP 128/82 | HR 60 | Temp 97.9°F | Ht 62.0 in

## 2024-04-23 DIAGNOSIS — M10079 Idiopathic gout, unspecified ankle and foot: Secondary | ICD-10-CM

## 2024-04-23 DIAGNOSIS — M109 Gout, unspecified: Secondary | ICD-10-CM | POA: Insufficient documentation

## 2024-04-23 DIAGNOSIS — I10 Essential (primary) hypertension: Secondary | ICD-10-CM

## 2024-04-23 DIAGNOSIS — E782 Mixed hyperlipidemia: Secondary | ICD-10-CM

## 2024-04-23 MED ORDER — PREDNISONE 20 MG PO TABS: 40.0000 mg | ORAL_TABLET | Freq: Every day | ORAL | 0 refills | Status: AC

## 2024-04-23 NOTE — Assessment & Plan Note (Signed)
 Started in left foot and in both on exam. Suspicious for gout given hydrochlorothiazide  and significant alcohol intake. She is prescribed prednisone  40 mg daily 1 week and return in 3-4 weeks for uric acid level which is ordered today. This is first flare. Depending on uric acid level we may need alternative for hydrochlorothiazide .

## 2024-04-23 NOTE — Patient Instructions (Signed)
 We have sent in prednisone  to take 2 pills daily for 1 week.  Then come back in about 3-4 weeks and come to the lab so we can check the uric acid level.

## 2024-04-23 NOTE — Progress Notes (Signed)
 Subjective:   Patient ID: Anna Hendricks, female    DOB: 07-14-1941, 83 y.o.   MRN: [id number]  Discussed the use of AI scribe software for clinical note transcription with the patient, who gave verbal consent to proceed.  History of Present Illness Anna Hendricks is an 83 year old female who presents with bilateral foot pain.  She has been experiencing pain in her feet for approximately one month. The pain initially started in the big toe of her left foot and has since affected her right foot as well. It is described as gradually worsening over time, with both feet appearing swollen. The pain is particularly severe in the toes, making it difficult to move them.  She has tried over-the-counter medications such as Tylenol and Aleve, but they have not been effective in alleviating the pain. The pain is exacerbated by pressure, such as standing or walking, and is more intense in the mid-foot and toe areas. Even light touches, such as a dog running across her feet, cause significant discomfort.  She attempted to alleviate the pain by having a pedicure, suspecting a possible infection in the nails, but this did not provide relief. She has not received any other specific treatments for this condition prior to this visit.  She expresses frustration with a previous medical visit where she did not receive satisfactory answers regarding edema and was billed unexpectedly.  Review of Systems  HENT: Negative.    Eyes: Negative.   Respiratory:  Negative for cough, chest tightness and shortness of breath.   Cardiovascular:  Negative for chest pain, palpitations and leg swelling.  Gastrointestinal:  Negative for abdominal distention, abdominal pain, constipation, diarrhea, nausea and vomiting.  Musculoskeletal:  Positive for arthralgias, gait problem and myalgias.  Skin: Negative.   Psychiatric/Behavioral: Negative.      Objective:  Physical Exam Constitutional:      Appearance: She is  well-developed.  HENT:     Head: Normocephalic and atraumatic.  Cardiovascular:     Rate and Rhythm: Normal rate and regular rhythm.  Pulmonary:     Effort: Pulmonary effort is normal. No respiratory distress.     Breath sounds: Normal breath sounds. No wheezing or rales.  Abdominal:     General: Bowel sounds are normal. There is no distension.     Palpations: Abdomen is soft.     Tenderness: There is no abdominal tenderness.  Musculoskeletal:        General: Tenderness present.     Cervical back: Normal range of motion.     Comments: Edema bilateral feet. Pain midfoot to toes and especially great toe left more than right but bilaterally  Skin:    General: Skin is warm and dry.  Neurological:     Mental Status: She is alert and oriented to person, place, and time.     Coordination: Coordination abnormal.     Comments: Wheelchair today     Vitals:   04/23/24 0848  BP: 128/82  Pulse: 60  Temp: 97.9 F (36.6 C)  TempSrc: Oral  SpO2: 96%  Height: 5' 2 (1.575 m)    Assessment and Plan Assessment & Plan Acute gout flare involving bilateral feet   The acute gout flare affects the big toe of the left foot and now involves the right foot. Prescribe prednisone  for one week, two pills per day. Order a uric acid level test in three to four weeks. Discuss potential medication adjustments if uric acid levels remain high, including changing hydrochlorothiazide   or adding a medication to enhance renal excretion of uric acid.  Difficulty walking due to foot pain   She experiences difficulty walking due to significant pain in the feet, primarily in the toes, associated with the acute gout flare. Address the underlying gout flare with prednisone  to alleviate pain and improve mobility.

## 2024-04-23 NOTE — Assessment & Plan Note (Signed)
 Checking CMP and adjust as needed. If uric acid high we may need to stop hydrochlorothiazide  due to gout flare. For now continue hydrochlorothiazide  and lisinopril  and amlodipine  for BP. BP at goal.

## 2024-04-23 NOTE — Assessment & Plan Note (Signed)
 Due for routine labs ordered to get done with uric acid. Adjust as needed lipitor.

## 2024-05-09 ENCOUNTER — Other Ambulatory Visit: Payer: Self-pay | Admitting: Internal Medicine
